# Patient Record
Sex: Female | Born: 1975 | Race: White | Hispanic: No | Marital: Married | Smoking: Current every day smoker
Health system: Southern US, Community
[De-identification: ages and names within clinical notes are randomized; demographics above are authoritative.]

## PROBLEM LIST (undated history)

## (undated) DIAGNOSIS — E282 Polycystic ovarian syndrome: Secondary | ICD-10-CM

## (undated) DIAGNOSIS — E785 Hyperlipidemia, unspecified: Secondary | ICD-10-CM

## (undated) DIAGNOSIS — K219 Gastro-esophageal reflux disease without esophagitis: Secondary | ICD-10-CM

## (undated) DIAGNOSIS — R51 Headache: Secondary | ICD-10-CM

## (undated) DIAGNOSIS — D72829 Elevated white blood cell count, unspecified: Secondary | ICD-10-CM

## (undated) DIAGNOSIS — Z8709 Personal history of other diseases of the respiratory system: Secondary | ICD-10-CM

## (undated) DIAGNOSIS — R7303 Prediabetes: Secondary | ICD-10-CM

## (undated) DIAGNOSIS — G4730 Sleep apnea, unspecified: Secondary | ICD-10-CM

## (undated) DIAGNOSIS — M1990 Unspecified osteoarthritis, unspecified site: Secondary | ICD-10-CM

## (undated) DIAGNOSIS — R002 Palpitations: Secondary | ICD-10-CM

## (undated) HISTORY — PX: PILONIDAL CYST EXCISION: SHX744

## (undated) HISTORY — PX: WISDOM TOOTH EXTRACTION: SHX21

## (undated) HISTORY — PX: SINUS SURGERY WITH INSTATRAK: SHX5215

## (undated) HISTORY — DX: Gastro-esophageal reflux disease without esophagitis: K21.9

## (undated) HISTORY — DX: Elevated white blood cell count, unspecified: D72.829

## (undated) HISTORY — DX: Hyperlipidemia, unspecified: E78.5

## (undated) HISTORY — PX: CHOLECYSTECTOMY: SHX55

---

## 2010-05-23 ENCOUNTER — Emergency Department (HOSPITAL_COMMUNITY): Admission: EM | Admit: 2010-05-23 | Discharge: 2010-05-23 | Payer: Self-pay | Admitting: Emergency Medicine

## 2010-05-26 DIAGNOSIS — R002 Palpitations: Secondary | ICD-10-CM

## 2010-05-27 ENCOUNTER — Ambulatory Visit: Payer: Self-pay | Admitting: Cardiology

## 2010-05-27 ENCOUNTER — Encounter: Payer: Self-pay | Admitting: Cardiology

## 2010-05-27 DIAGNOSIS — F17200 Nicotine dependence, unspecified, uncomplicated: Secondary | ICD-10-CM

## 2010-06-09 ENCOUNTER — Ambulatory Visit: Payer: Self-pay

## 2010-06-09 ENCOUNTER — Ambulatory Visit (HOSPITAL_COMMUNITY): Admission: RE | Admit: 2010-06-09 | Discharge: 2010-06-09 | Payer: Self-pay | Admitting: Cardiology

## 2010-06-09 ENCOUNTER — Ambulatory Visit: Payer: Self-pay | Admitting: Internal Medicine

## 2010-07-08 ENCOUNTER — Encounter: Payer: Self-pay | Admitting: Cardiology

## 2010-07-08 ENCOUNTER — Ambulatory Visit: Payer: Self-pay | Admitting: Cardiology

## 2010-07-08 DIAGNOSIS — I517 Cardiomegaly: Secondary | ICD-10-CM | POA: Insufficient documentation

## 2010-11-24 NOTE — Assessment & Plan Note (Signed)
Summary: [facility]   Visit Type:  Post-hospital  CC:  palpitations.  History of Present Illness: 35 year old female for evaluation of palpitations. Seen in the emergency room on July 30. D-dimer, one set of cardiac markers, potassium, urine pregnancy test normal. White blood cell count was mildly elevated. No significant arrhythmias noted in the emergency room. Because of palpitations we were asked to further evaluate. The patient states that she has had intermittent palpitations for some time. They are described as a skip followed by a pause. They are not sustained. There is no associated chest pain, shortness of breath or syncope. She otherwise does not have significant dyspnea on exertion, orthopnea, PND, pedal edema, exertional chest pain or history of syncope.  Preventive Screening-Counseling & Management  Alcohol-Tobacco     Smoking Status: current  Current Medications (verified): 1)  Birth Control Pill .... Once Daily  Allergies: 1)  ! Sulfa 2)  ! * Shellfish  Past History:  Past Medical History: Borderline hyperlipidemia Mild elevation in WBC followed by oncology  Past Surgical History: Wisdom teeth Sinus surgery Cholecystectomy Pilonidal cyst removed  Family History: Reviewed history and no changes required. Father and brothers with Htn  Social History: Reviewed history and no changes required. Full Time Single  Tobacco Use - Yes.  Alcohol Use - no Smoking Status:  current  Review of Systems       Recent sinusitis but no fevers or chills, productive cough, hemoptysis, dysphasia, odynophagia, melena, hematochezia, dysuria, hematuria, rash, seizure activity, orthopnea, PND, pedal edema, claudication. Remaining systems are negative.   Vital Signs:  Patient profile:   35 year old female Height:      65 inches Weight:      317 pounds BMI:     52.94 Pulse rate:   78 / minute Pulse rhythm:   regular Resp:     18 per minute BP sitting:   110 / 70   (left arm) Cuff size:   large  Vitals Entered By: Vikki Ports (May 27, 2010 10:47 AM)  Physical Exam  General:  Well developed/morbidly obese in NAD Skin warm/dry Patient not depressed No peripheral clubbing Back-normal HEENT-normal/normal eyelids Neck supple/normal carotid upstroke bilaterally; no bruits; no JVD; no thyromegaly chest - CTA/ normal expansion CV - RRR/normal S1 and S2; no murmurs, rubs or gallops; no rub; PMI nondisplaced Abdomen -NT/ND, no HSM, no mass, + bowel sounds, no bruit 2+ femoral pulses, no bruits Ext-no edema, chords, 2+ DP Neuro-grossly nonfocal     EKG  Procedure date:  05/27/2010  Findings:      Sinus rhythm at a rate of 78. Axis normal. No significant ST changes.  Impression & Recommendations:  Problem # 1:  PALPITATIONS (ICD-785.1) Patient is describing what sounds to be either PVCs or PACs. Her palpitations are not sustained. We will schedule an echocardiogram to quantify LV function. I've asked her to decrease her caffeine use. If her symptoms persist we will consider a beta blocker in the future. We could also consider a CardioNet monitor. Orders: Echocardiogram (Echo)  Problem # 2:  TOBACCO ABUSE (ICD-305.1) Patient counseled on discontinuing her between 3-10 minutes.  Patient Instructions: 1)  Your physician recommends that you schedule a follow-up appointment in: 6 WEEKS 2)  Your physician has requested that you have an echocardiogram.  Echocardiography is a painless test that uses sound waves to create images of your heart. It provides your doctor with information about the size and shape of your heart and how well  your heart's chambers and valves are working.  This procedure takes approximately one hour. There are no restrictions for this procedure.

## 2010-11-24 NOTE — Assessment & Plan Note (Signed)
Summary: [facility]   Visit Type:  Follow-up  CC:  Pt. still having palpitations.  History of Present Illness: 35 year old female I saw in August of 2011 for evaluation of palpitations. Seen in the emergency room on July 30. D-dimer, one set of cardiac markers, potassium, urine pregnancy test normal. White blood cell count was mildly elevated. No significant arrhythmias noted in the emergency room. Echocardiogram in August of 2011 revealed normal LV function and mild RVE. Since I saw her previously, she denies dyspnea, chest pain, pedal edema or syncope. She has discontinued her caffeine use. Patient has dced caffeine. She still has an occasional skip but not as frequently. There is no associated symptoms.  Current Medications (verified): 1)  Birth Control Pill .... Once Daily  Allergies: 1)  ! Sulfa 2)  ! * Shellfish  Past History:  Past Medical History: Reviewed history from 05/27/2010 and no changes required. Borderline hyperlipidemia Mild elevation in WBC followed by oncology  Past Surgical History: Reviewed history from 05/27/2010 and no changes required. Wisdom teeth Sinus surgery Cholecystectomy Pilonidal cyst removed  Social History: Reviewed history from 05/27/2010 and no changes required. Full Time Single  Tobacco Use - Yes.  Alcohol Use - no  Review of Systems       no fevers or chills, productive cough, hemoptysis, dysphasia, odynophagia, melena, hematochezia, dysuria, hematuria, rash, seizure activity, orthopnea, PND, pedal edema, claudication. Remaining systems are negative.   Vital Signs:  Patient profile:   35 year old female Height:      65 inches Weight:      328.50 pounds BMI:     54.86 Pulse rate:   80 / minute Pulse rhythm:   regular Resp:     18 per minute BP sitting:   120 / 66  (right arm) Cuff size:   large  Vitals Entered By: Vikki Ports (July 08, 2010 11:28 AM)  Physical Exam  General:  Well-developed obese in no  acute distress.  Skin is warm and dry.  HEENT is normal.  Neck is supple. No thyromegaly.  Chest is clear to auscultation with normal expansion.  Cardiovascular exam is regular rate and rhythm.  Abdominal exam nontender or distended. No masses palpated. Extremities show no edema. neuro grossly intact    Impression & Recommendations:  Problem # 1:  PALPITATIONS (ICD-785.1) Most likely related to PACs or PVCs. They have improved off of  caffeine. LV function normal. We will follow this expectantly. If her symptoms worsen in the future we will add a beta blocker and consider monitor.  Problem # 2:  CARDIOMEGALY (ICD-429.3) Mild right ventricular enlargement on echo. This is most likely related to obesity hypoventilation syndrome versus sleep apnea and increased pulmonary pressures. I have asked her to followup with her primary care physician for possible sleep study and initiation of CPAP as needed.  Problem # 3:  TOBACCO ABUSE (ICD-305.1) Patient counseled on discontinuing.

## 2011-01-09 LAB — URINALYSIS, ROUTINE W REFLEX MICROSCOPIC
Nitrite: NEGATIVE
Specific Gravity, Urine: 1.005 (ref 1.005–1.030)
Urobilinogen, UA: 0.2 mg/dL (ref 0.0–1.0)

## 2011-01-09 LAB — POCT CARDIAC MARKERS
CKMB, poc: <1 ng/mL — ABNORMAL LOW (ref 1.0–8.0)
Myoglobin, poc: 35.5 ng/mL (ref 12–200)
Troponin i, poc: <0.05 ng/mL (ref 0.00–0.09)

## 2011-01-09 LAB — POCT PREGNANCY, URINE: Preg Test, Ur: NEGATIVE

## 2011-01-09 LAB — DIFFERENTIAL
Basophils Relative: 2 % — ABNORMAL HIGH (ref 0–1)
Eosinophils Absolute: 0.3 10*3/uL (ref 0.0–0.7)
Eosinophils Relative: 2 % (ref 0–5)
Lymphs Abs: 4.7 10*3/uL — ABNORMAL HIGH (ref 0.7–4.0)
Neutro Abs: 7.3 10*3/uL (ref 1.7–7.7)
Neutrophils Relative %: 54 % (ref 43–77)

## 2011-01-09 LAB — RAPID URINE DRUG SCREEN, HOSP PERFORMED
Cocaine: NOT DETECTED
Tetrahydrocannabinol: NOT DETECTED

## 2011-01-09 LAB — URINE MICROSCOPIC-ADD ON

## 2011-01-09 LAB — BASIC METABOLIC PANEL
BUN: 8 mg/dL (ref 6–23)
Calcium: 8.6 mg/dL (ref 8.4–10.5)
Creatinine, Ser: 0.78 mg/dL (ref 0.4–1.2)
Glucose, Bld: 118 mg/dL — ABNORMAL HIGH (ref 70–99)
Sodium: 139 mEq/L (ref 135–145)

## 2011-01-09 LAB — CBC
MCH: 30.9 pg (ref 26.0–34.0)
Platelets: 265 10*3/uL (ref 150–400)
RDW: 13.8 % (ref 11.5–15.5)

## 2011-06-08 ENCOUNTER — Encounter: Payer: Self-pay | Admitting: Cardiology

## 2011-10-25 ENCOUNTER — Emergency Department (HOSPITAL_COMMUNITY)
Admission: EM | Admit: 2011-10-25 | Discharge: 2011-10-25 | Disposition: A | Discharge status: Home / Self Care | Payer: BC Managed Care – PPO | Attending: Emergency Medicine | Admitting: Emergency Medicine

## 2011-10-25 ENCOUNTER — Encounter (HOSPITAL_COMMUNITY): Payer: Self-pay | Admitting: *Deleted

## 2011-10-25 DIAGNOSIS — K089 Disorder of teeth and supporting structures, unspecified: Secondary | ICD-10-CM | POA: Insufficient documentation

## 2011-10-25 DIAGNOSIS — R6884 Jaw pain: Secondary | ICD-10-CM | POA: Insufficient documentation

## 2011-10-25 DIAGNOSIS — K047 Periapical abscess without sinus: Secondary | ICD-10-CM | POA: Insufficient documentation

## 2011-10-25 MED ORDER — OXYCODONE-ACETAMINOPHEN 5-325 MG PO TABS
2.0000 | ORAL_TABLET | Freq: Once | ORAL | Status: AC
  Administered 2011-10-25: 2 via ORAL
  Filled 2011-10-25: qty 2

## 2011-10-25 MED ORDER — PENICILLIN V POTASSIUM 500 MG PO TABS: 500.0000 mg | ORAL_TABLET | Freq: Four times a day (QID) | ORAL | Status: AC

## 2011-10-25 MED ORDER — LIDOCAINE HCL (PF) 1 % IJ SOLN
5.0000 mL | Freq: Once | INTRAMUSCULAR | Status: DC
  Filled 2011-10-25: qty 5

## 2011-10-25 MED ORDER — ONDANSETRON 4 MG PO TBDP
8.0000 mg | ORAL_TABLET | Freq: Once | ORAL | Status: AC
  Administered 2011-10-25: 8 mg via ORAL
  Filled 2011-10-25: qty 2

## 2011-10-25 MED ORDER — LIDOCAINE VISCOUS 2 % MT SOLN
OROMUCOSAL | Status: AC
  Filled 2011-10-25: qty 15

## 2011-10-25 MED ORDER — OXYCODONE-ACETAMINOPHEN 5-325 MG PO TABS: 1.0000 | ORAL_TABLET | ORAL | Status: AC | PRN

## 2011-10-25 NOTE — ED Notes (Signed)
Every documentation done by Elvera Lennox RN- Error in documentation. Wrong patient

## 2011-10-25 NOTE — ED Notes (Addendum)
No Blank Note

## 2011-10-25 NOTE — ED Provider Notes (Signed)
History     CSN: [id number]  Arrival date & time 10/25/11  0402   First MD Initiated Contact with Patient 10/25/11 [phone]      Chief Complaint  Patient presents with  . Jaw Pain     Patient is a 35 y.o. female presenting with tooth pain. The history is provided by the patient.  Dental PainThe primary symptoms include mouth pain. Primary symptoms do not include fever. The symptoms began 2 days ago. The symptoms are worsening. The symptoms are new. The symptoms occur constantly.    Past Medical History  Diagnosis Date  . Borderline hyperlipidemia   . Elevated WBC count     mild. followed by oncology    Past Surgical History  Procedure Date  . Wisdom tooth extraction   . Sinus surgery with instatrak   . Cholecystectomy   . Pilonidal cyst excision     Family History  Problem Relation Age of Onset  . Hypertension Father   . Hypertension Brother     History  Substance Use Topics  . Smoking status: Current Everyday Smoker -- 1.0 packs/day  . Smokeless tobacco: Never Used  . Alcohol Use: No    OB History    Grav Para Term Preterm Abortions TAB SAB Ect Mult Living                  Review of Systems  Constitutional: Negative for fever.  Gastrointestinal: Negative for vomiting.    Allergies  Shrimp and Sulfonamide derivatives  Home Medications   Current Outpatient Rx  Name Route Sig Dispense Refill  . IBUPROFEN 200 MG PO TABS Oral Take 400 mg by mouth every 6 (six) hours as needed. For pain     . LANSOPRAZOLE 30 MG PO CPDR Oral Take 30 mg by mouth daily as needed. For heartburn      . NAPROXEN SODIUM 220 MG PO TABS Oral Take 220 mg by mouth 2 (two) times daily as needed. For pain      . NON FORMULARY  BIRTH CONTROL PILL. Once daily       BP 154/94  Pulse 86  Temp(Src) 98.1 F (36.7 C) (Oral)  Resp 18  SpO2 98%  Physical Exam  CONSTITUTIONAL: Well developed/well nourished HEAD AND FACE: Normocephalic/atraumatic EYES: EOMI/PERRL ENMT: Mucous  membranes moist, no trismus, dental decay noted At site of previous extraction site of tooth 1, there is swelling/tenderness to surrounding gingiva NECK: supple no meningeal signs CV: S1/S2 noted, no murmurs/rubs/gallops noted LUNGS: Lungs are clear to auscultation bilaterally, no apparent distress ABDOMEN: soft, nontender, no rebound or guarding NEURO: Pt is awake/alert, moves all extremitiesx4 No facial droop, no facial numbness noted EXTREMITIES: pulses normal, full ROM SKIN: warm, color normal PSYCH: no abnormalities of mood noted   ED Course  Dental Performed by: Joya Gaskins Authorized by: Joya Gaskins Consent: Verbal consent obtained. Risks and benefits: risks, benefits and alternatives were discussed Consent given by: patient Patient understanding: patient states understanding of the procedure being performed Patient identity confirmed: verbally with patient Preparation: Patient was prepped and draped in the usual sterile fashion. Local anesthesia used: yes Anesthesia: nerve block Local anesthetic: bupivacaine 0.5% with epinephrine Anesthetic total: 1.5 ml Patient sedated: no Patient tolerance: Patient tolerated the procedure well with no immediate complications. Comments: NEEDLE DRAINAGE OF DENTAL ABSCESS, SMALL AMT OF PUS WAS EXTRACTED, NO COMPLICATIONS  NERVE BLOCK Performed by: Joya Gaskins Authorized by: Joya Gaskins Consent: Verbal consent obtained. Risks and benefits:  risks, benefits and alternatives were discussed Consent given by: patient Patient identity confirmed: verbally with patient Indications: pain relief Body area: face/mouth Nerve: infraorbital Laterality: right Patient sedated: no Preparation: Patient was prepped and draped in the usual sterile fashion. Location technique: anatomical landmarks Local anesthetic: bupivacaine 0.5% with epinephrine Anesthetic total: 1.5 ml Patient tolerance: Patient tolerated the procedure  well with no immediate complications.        MDM  Nursing notes reviewed and considered in documentation  Pt had molars extracted in the past, not recently        Joya Gaskins, MD 10/25/11 [phone]

## 2011-10-25 NOTE — ED Notes (Signed)
Pt states that her jaw and teeth hurt. She states she thinks she has sinus infection.

## 2013-08-15 ENCOUNTER — Encounter (INDEPENDENT_AMBULATORY_CARE_PROVIDER_SITE_OTHER): Payer: Self-pay | Admitting: Surgery

## 2013-08-15 ENCOUNTER — Other Ambulatory Visit (INDEPENDENT_AMBULATORY_CARE_PROVIDER_SITE_OTHER): Payer: Self-pay

## 2013-08-15 ENCOUNTER — Encounter (INDEPENDENT_AMBULATORY_CARE_PROVIDER_SITE_OTHER): Payer: Self-pay

## 2013-08-15 ENCOUNTER — Ambulatory Visit (INDEPENDENT_AMBULATORY_CARE_PROVIDER_SITE_OTHER): Payer: BC Managed Care – PPO | Admitting: Surgery

## 2013-08-15 NOTE — Patient Instructions (Signed)

## 2013-08-15 NOTE — Progress Notes (Signed)
Chief Complaint:  Morbid obesity BMI 58  History of Present Illness:  Crystal Huerta is an 37 y.o. female who comes in today with her husband having been worked up at [facility] and topical for a Roux-en-Y gastric bypass. She has done all the requisite it's 4 surgery but then the surgeons left hand she felt abandoned. She comes in today seeking to have Roux-en-Y gastric bypass.  She has had success with diets in the past having lost in excess of 70 pounds and combine that with exercise. Following this success she would regain her weight. Her comorbidities include polycystic ovary syndrome, and GERD with sometimes nocturnal regurgitation and choking, and history of elevated cholesterol although currently is under control.  Past Medical History  Diagnosis Date  . Borderline hyperlipidemia   . Elevated WBC count     mild. followed by oncology    Past Surgical History  Procedure Laterality Date  . Wisdom tooth extraction    . Sinus surgery with instatrak    . Cholecystectomy    . Pilonidal cyst excision      Current Outpatient Prescriptions  Medication Sig Dispense Refill  . ibuprofen (ADVIL,MOTRIN) 200 MG tablet Take 400 mg by mouth every 6 (six) hours as needed. For pain       . lansoprazole (PREVACID) 30 MG capsule Take 30 mg by mouth daily as needed. For heartburn        . naproxen sodium (ANAPROX) 220 MG tablet Take 220 mg by mouth 2 (two) times daily as needed. For pain        . NON FORMULARY BIRTH CONTROL PILL. Once daily        No current facility-administered medications for this visit.   Shrimp and Sulfonamide derivatives Family History  Problem Relation Age of Onset  . Hypertension Father   . Hypertension Brother    Social History:   reports that she has been smoking.  She has never used smokeless tobacco. She reports that she does not drink alcohol or use illicit drugs.   REVIEW OF SYSTEMS - PERTINENT POSITIVES ONLY: Positive for smoking. Negative for DVT. Positive for  GERD with nocturnal reflux. Positive for palpitations which have been described to her as benign.    Physical Exam:   Blood pressure 132/90, pulse 80, resp. rate 16, height 5\' 5"  (1.651 m), weight 346 lb 12.8 oz (157.307 kg). Body mass index is 57.71 kg/(m^2).  Gen:  WDWN white female NAD  Neurological: Alert and oriented to person, place, and time. Motor and sensory function is grossly intact  Head: Normocephalic and atraumatic.  Eyes: Conjunctivae are normal. Pupils are equal, round, and reactive to light. No scleral icterus.  Neck: Normal range of motion. Neck supple. No tracheal deviation or thyromegaly present.  Cardiovascular:  SR without murmurs or gallops.  No carotid bruits Respiratory: Effort normal.  No respiratory distress. No chest wall tenderness. Breath sounds normal.  No wheezes, rales or rhonchi.  Abdomen:  Obese with scars from laparoscopic cholecystectomy GU: Musculoskeletal: Normal range of motion. Extremities are nontender. No cyanosis, edema or clubbing noted Lymphadenopathy: No cervical, preauricular, postauricular or axillary adenopathy is present Skin: Skin is warm and dry. No rash noted. No diaphoresis. No erythema. No pallor. Pscyh: Normal mood and affect. Behavior is normal. Judgment and thought content normal.   LABORATORY RESULTS: No results found for this or any previous visit (from the past 48 hour(s)).  RADIOLOGY RESULTS: No results found.  Problem List: Patient Active Problem List  Diagnosis Date Noted  . CARDIOMEGALY 07/08/2010  . TOBACCO ABUSE 05/27/2010  . 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 Will have her see our dietitian. I talked to her extensively about going on a low carbohydrate diet including the avoidance of artificial sweeteners.    Matt B. Daphine Deutscher, MD, [facility] [phone] beeper [phone]  08/15/2013 2:12 PM

## 2013-08-20 LAB — CBC WITH DIFFERENTIAL/PLATELET
Basophils Relative: 1 % (ref 0–1)
HCT: 40.9 % (ref 36.0–46.0)
MCV: 84.3 fL (ref 78.0–100.0)
Neutro Abs: 8.2 10*3/uL — ABNORMAL HIGH (ref 1.7–7.7)
Neutrophils Relative %: 54 % (ref 43–77)
RDW: 14.5 % (ref 11.5–15.5)

## 2013-08-25 ENCOUNTER — Encounter: Payer: Self-pay | Admitting: Dietician

## 2013-08-25 ENCOUNTER — Encounter: Payer: BC Managed Care – PPO | Attending: Surgery | Admitting: Dietician

## 2013-08-25 DIAGNOSIS — Z713 Dietary counseling and surveillance: Secondary | ICD-10-CM | POA: Insufficient documentation

## 2013-08-25 NOTE — Patient Instructions (Signed)
Patient to call the [facility] to enroll in Pre-Op and Post-Op Nutrition Education when surgery date is scheduled. 

## 2013-08-25 NOTE — Progress Notes (Signed)
  Pre-Op Assessment Visit:  Pre-Operative RYGB Surgery  Medical Nutrition Therapy:  Appt start time: 1445   End time:  1515.  Patient was seen on 08/25/13 for Pre-Operative RYGB Nutrition Assessment. Assessment and letter of approval faxed to [facility] Bariatric Surgery Program coordinator on 08/25/13.   Handouts given during visit include:  Pre-Op Goals Bariatric Surgery Protein Shakes  Patient to call the [facility] to enroll in Pre-Op and Post-Op Nutrition Education when surgery date is scheduled.

## 2013-11-12 NOTE — Progress Notes (Signed)
Dr. Daphine DeutscherMartin - Please [facility].  Thanks.

## 2013-11-13 ENCOUNTER — Telehealth: Payer: Self-pay | Admitting: Dietician

## 2013-11-13 NOTE — Telephone Encounter (Signed)
Sent Yulonda the Pre-Op diet to follow for 2 weeks before surgery and the Post-Op diet to follow for 2 weeks after RYGB.

## 2013-11-19 ENCOUNTER — Encounter (HOSPITAL_COMMUNITY): Payer: Self-pay | Admitting: Pharmacy Technician

## 2013-11-19 NOTE — Patient Instructions (Addendum)
Ova FreshwaterLori Spoon  11/19/2013                           YOUR PROCEDURE IS SCHEDULED ON: 11/27/13               PLEASE REPORT TO SHORT STAY CENTER AT : 8:15 AM               CALL THIS NUMBER IF ANY PROBLEMS THE DAY OF SURGERY :               [phone]                      REMEMBER:   Do not eat food or drink liquids AFTER MIDNIGHT   Take these medicines the morning of surgery with A SIP OF WATER: PREVACID   Do not wear jewelry, make-up   Do not wear lotions, powders, or perfumes.   Do not shave legs or underarms 12 hrs. before surgery (men may shave face)  Do not bring valuables to the hospital.  Contacts, dentures or bridgework may not be worn into surgery.  Leave suitcase in the car. After surgery it may be brought to your room.  For patients admitted to the hospital more than one night, checkout time is 11:00                          The day of discharge.   Patients discharged the day of surgery will not be allowed to drive home                             If going home same day of surgery, must have someone stay with you first                           24 hrs at home and arrange for some one to drive you home from hospital.    Special Instructions:   Please read over the following fact sheets that you were given:                                 2. [place] PREPARING FOR SURGERY SHEET                                                X_____________________________________________________________________        Failure to follow these instructions may result in cancellation of your surgery

## 2013-11-20 ENCOUNTER — Encounter (INDEPENDENT_AMBULATORY_CARE_PROVIDER_SITE_OTHER): Payer: Self-pay

## 2013-11-20 ENCOUNTER — Encounter (HOSPITAL_COMMUNITY): Payer: Self-pay

## 2013-11-20 ENCOUNTER — Encounter (HOSPITAL_COMMUNITY)
Admission: RE | Admit: 2013-11-20 | Discharge: 2013-11-20 | Disposition: A | Discharge status: Home / Self Care | Payer: BC Managed Care – PPO | Source: Ambulatory Visit | Attending: Surgery | Admitting: Surgery

## 2013-11-20 DIAGNOSIS — Z01810 Encounter for preprocedural cardiovascular examination: Secondary | ICD-10-CM | POA: Insufficient documentation

## 2013-11-20 DIAGNOSIS — Z01812 Encounter for preprocedural laboratory examination: Secondary | ICD-10-CM | POA: Insufficient documentation

## 2013-11-20 DIAGNOSIS — Z01818 Encounter for other preprocedural examination: Secondary | ICD-10-CM | POA: Insufficient documentation

## 2013-11-20 HISTORY — DX: Palpitations: R00.2

## 2013-11-20 HISTORY — DX: Unspecified osteoarthritis, unspecified site: M19.90

## 2013-11-20 HISTORY — DX: Personal history of other diseases of the respiratory system: Z87.09

## 2013-11-20 HISTORY — DX: Prediabetes: R73.03

## 2013-11-20 HISTORY — DX: Polycystic ovarian syndrome: E28.2

## 2013-11-20 HISTORY — DX: Headache: R51

## 2013-11-20 HISTORY — DX: Sleep apnea, unspecified: G47.30

## 2013-11-20 LAB — COMPREHENSIVE METABOLIC PANEL
ALT: 20 U/L (ref 0–35)
AST: 16 U/L (ref 0–37)
Albumin: 3.4 g/dL — ABNORMAL LOW (ref 3.5–5.2)
Alkaline Phosphatase: 79 U/L (ref 39–117)
BUN: 10 mg/dL (ref 6–23)
CHLORIDE: 102 meq/L (ref 96–112)
CO2: 23 mEq/L (ref 19–32)
Calcium: 9.1 mg/dL (ref 8.4–10.5)
Creatinine, Ser: 0.79 mg/dL (ref 0.50–1.10)
GFR calc Af Amer: >90 mL/min (ref >90)
GFR calc non Af Amer: >90 mL/min (ref >90)
Glucose, Bld: 128 mg/dL — ABNORMAL HIGH (ref 70–99)
Potassium: 4 mEq/L (ref 3.7–5.3)
Sodium: 139 mEq/L (ref 137–147)
Total Bilirubin: 0.2 mg/dL — ABNORMAL LOW (ref 0.3–1.2)
Total Protein: 7.5 g/dL (ref 6.0–8.3)

## 2013-11-20 LAB — CBC
HEMATOCRIT: 42.3 % (ref 36.0–46.0)
HEMOGLOBIN: 14.4 g/dL (ref 12.0–15.0)
MCH: 29.6 pg (ref 26.0–34.0)
MCHC: 34 g/dL (ref 30.0–36.0)
MCV: 86.9 fL (ref 78.0–100.0)
PLATELETS: 258 10*3/uL (ref 150–400)
RBC: 4.87 MIL/uL (ref 3.87–5.11)
RDW: 14.2 % (ref 11.5–15.5)
WBC: 12.1 10*3/uL — AB (ref 4.0–10.5)

## 2013-11-20 LAB — HCG, SERUM, QUALITATIVE: Preg, Serum: NEGATIVE

## 2013-11-21 NOTE — Progress Notes (Signed)
SLEEP STUDY ON CHART

## 2013-11-23 ENCOUNTER — Encounter (INDEPENDENT_AMBULATORY_CARE_PROVIDER_SITE_OTHER): Payer: Self-pay | Admitting: Surgery

## 2013-11-23 ENCOUNTER — Ambulatory Visit (INDEPENDENT_AMBULATORY_CARE_PROVIDER_SITE_OTHER): Payer: BC Managed Care – PPO | Admitting: Surgery

## 2013-11-23 NOTE — Progress Notes (Signed)
Chief Complaint:  Morbid obesity BMI 58  History of Present Illness:  Crystal Huerta is an 38 y.o. female who comes in today with her husband having been worked up at [facility] and prepared for a Roux-en-Y gastric bypass. She has done all the workup for surgery but then the surgeons left hand she felt abandoned. She comes in today seeking to have Roux-en-Y gastric bypass.  I have discussed the procedure again with her and she is scheduled for next Tuesday.    She has had success with diets in the past having lost in excess of 70 pounds and combine that with exercise. Following this success she would regain her weight. Her comorbidities include polycystic ovary syndrome, and GERD with sometimes nocturnal regurgitation and choking, and history of elevated cholesterol although currently is under control.  Past Medical History  Diagnosis Date  . Borderline hyperlipidemia   . Elevated WBC count     mild. followed by oncology  . GERD (gastroesophageal reflux disease)   . Heart palpitations     with caffien  . History of sinusitis   . Headache(784.0)     migraines  . Arthritis   . Polycystic ovarian disease   . Borderline diabetes   . Sleep apnea     "mild sleep apnea" does not need c pap per pt    Past Surgical History  Procedure Laterality Date  . Wisdom tooth extraction    . Sinus surgery with instatrak    . Cholecystectomy    . Pilonidal cyst excision      Current Outpatient Prescriptions  Medication Sig Dispense Refill  . ibuprofen (ADVIL,MOTRIN) 200 MG tablet Take 400 mg by mouth every 6 (six) hours as needed. For pain       . lansoprazole (PREVACID) 30 MG capsule Take 30 mg by mouth daily as needed. For heartburn        . Vitamin D, Ergocalciferol, (DRISDOL) 50000 UNITS CAPS capsule Take 50,000 Units by mouth 2 (two) times a week. Thursday and Saturday       No current facility-administered medications for this visit.   Shrimp and Sulfonamide derivatives Family History   Problem Relation Age of Onset  . Hypertension Father   . Hypertension Brother   . GER disease Other   . Hyperlipidemia Other   . Hypertension Other   . Stroke Other   . Diabetes Other   . Obesity Other   . Sleep apnea Other    Social History:   reports that she has been smoking.  She has never used smokeless tobacco. She reports that she does not drink alcohol or use illicit drugs.   REVIEW OF SYSTEMS - PERTINENT POSITIVES ONLY: Positive for smoking. Negative for DVT. Positive for GERD with nocturnal reflux. Positive for palpitations which have been described to her as benign.    Physical Exam:   Height 5\' 5"  (1.651 m), weight 350 lb (158.759 kg), last menstrual period 08/20/2013. Body mass index is 58.24 kg/(m^2).  Gen:  WDWN white female NAD  Neurological: Alert and oriented to person, place, and time. Motor and sensory function is grossly intact  Head: Normocephalic and atraumatic.  Eyes: Conjunctivae are normal. Pupils are equal, round, and reactive to light. No scleral icterus.  Neck: Normal range of motion. Neck supple. No tracheal deviation or thyromegaly present.  Cardiovascular:  SR without murmurs or gallops.  No carotid bruits Respiratory: Effort normal.  No respiratory distress. No chest wall tenderness. Breath sounds normal.  No wheezes,  rales or rhonchi.  Abdomen:  Obese with scars from laparoscopic cholecystectomy GU: Musculoskeletal: Normal range of motion. Extremities are nontender. No cyanosis, edema or clubbing noted Lymphadenopathy: No cervical, preauricular, postauricular or axillary adenopathy is present Skin: Skin is warm and dry. No rash noted. No diaphoresis. No erythema. No pallor. Pscyh: Normal mood and affect. Behavior is normal. Judgment and thought content normal.   LABORATORY RESULTS: No results found for this or any previous visit (from the past 48 hour(s)).  RADIOLOGY RESULTS: No results found.  Problem List: Patient Active Problem List    Diagnosis Date Noted  . Morbid obesity BMI 58 08/15/2013  . CARDIOMEGALY 07/08/2010  . TOBACCO ABUSE 05/27/2010  . 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     Matt B. Daphine Deutscher, MD, [facility] [phone] beeper [phone]  11/23/2013 3:50 PM

## 2013-11-23 NOTE — Patient Instructions (Signed)

## 2013-11-26 ENCOUNTER — Other Ambulatory Visit (INDEPENDENT_AMBULATORY_CARE_PROVIDER_SITE_OTHER): Payer: Self-pay | Admitting: Surgery

## 2013-11-27 ENCOUNTER — Encounter (HOSPITAL_COMMUNITY)
Admission: RE | Disposition: A | Discharge status: Home / Self Care | Payer: Self-pay | Source: Ambulatory Visit | Attending: Surgery

## 2013-11-27 ENCOUNTER — Encounter (HOSPITAL_COMMUNITY): Payer: BC Managed Care – PPO | Admitting: Anesthesiology

## 2013-11-27 ENCOUNTER — Inpatient Hospital Stay (HOSPITAL_COMMUNITY): Payer: BC Managed Care – PPO | Admitting: Anesthesiology

## 2013-11-27 ENCOUNTER — Inpatient Hospital Stay (HOSPITAL_COMMUNITY)
Admission: RE | Admit: 2013-11-27 | Discharge: 2013-11-29 | DRG: 621 | Disposition: A | Discharge status: Home / Self Care | Payer: BC Managed Care – PPO | Source: Ambulatory Visit | Attending: Surgery | Admitting: Surgery

## 2013-11-27 ENCOUNTER — Encounter (HOSPITAL_COMMUNITY): Payer: Self-pay | Admitting: *Deleted

## 2013-11-27 DIAGNOSIS — F17200 Nicotine dependence, unspecified, uncomplicated: Secondary | ICD-10-CM | POA: Diagnosis present

## 2013-11-27 DIAGNOSIS — E282 Polycystic ovarian syndrome: Secondary | ICD-10-CM

## 2013-11-27 DIAGNOSIS — Z01812 Encounter for preprocedural laboratory examination: Secondary | ICD-10-CM

## 2013-11-27 DIAGNOSIS — Z791 Long term (current) use of non-steroidal anti-inflammatories (NSAID): Secondary | ICD-10-CM

## 2013-11-27 DIAGNOSIS — M129 Arthropathy, unspecified: Secondary | ICD-10-CM | POA: Diagnosis present

## 2013-11-27 DIAGNOSIS — Z79899 Other long term (current) drug therapy: Secondary | ICD-10-CM

## 2013-11-27 DIAGNOSIS — E7800 Pure hypercholesterolemia, unspecified: Secondary | ICD-10-CM | POA: Diagnosis present

## 2013-11-27 DIAGNOSIS — K2100 Gastro-esophageal reflux disease with esophagitis, without bleeding: Secondary | ICD-10-CM

## 2013-11-27 DIAGNOSIS — Z6841 Body Mass Index (BMI) 40.0 and over, adult: Secondary | ICD-10-CM

## 2013-11-27 DIAGNOSIS — K219 Gastro-esophageal reflux disease without esophagitis: Secondary | ICD-10-CM | POA: Diagnosis present

## 2013-11-27 DIAGNOSIS — G4730 Sleep apnea, unspecified: Secondary | ICD-10-CM | POA: Diagnosis present

## 2013-11-27 DIAGNOSIS — Z9884 Bariatric surgery status: Secondary | ICD-10-CM

## 2013-11-27 DIAGNOSIS — Z01810 Encounter for preprocedural cardiovascular examination: Secondary | ICD-10-CM

## 2013-11-27 HISTORY — PX: GASTRIC ROUX-EN-Y: SHX5262

## 2013-11-27 LAB — CBC
HCT: 41 % (ref 36.0–46.0)
Hemoglobin: 14 g/dL (ref 12.0–15.0)
MCH: 29.9 pg (ref 26.0–34.0)
MCHC: 34.1 g/dL (ref 30.0–36.0)
MCV: 87.4 fL (ref 78.0–100.0)
PLATELETS: 289 10*3/uL (ref 150–400)
RBC: 4.69 MIL/uL (ref 3.87–5.11)
RDW: 14.4 % (ref 11.5–15.5)
WBC: 20.4 10*3/uL — AB (ref 4.0–10.5)

## 2013-11-27 LAB — HEMOGLOBIN AND HEMATOCRIT, BLOOD
HEMATOCRIT: 42.4 % (ref 36.0–46.0)
Hemoglobin: 14.1 g/dL (ref 12.0–15.0)

## 2013-11-27 LAB — CREATININE, SERUM
Creatinine, Ser: 0.9 mg/dL (ref 0.50–1.10)
GFR, EST NON AFRICAN AMERICAN: 80 mL/min — AB (ref >90)

## 2013-11-27 SURGERY — LAPAROSCOPIC ROUX-EN-Y GASTRIC BYPASS WITH UPPER ENDOSCOPY
Anesthesia: General

## 2013-11-27 MED ORDER — MIDAZOLAM HCL 2 MG/2ML IJ SOLN
INTRAMUSCULAR | Status: AC
  Filled 2013-11-27: qty 2

## 2013-11-27 MED ORDER — TISSEEL VH 10 ML EX KIT
PACK | CUTANEOUS | Status: AC
  Filled 2013-11-27: qty 1

## 2013-11-27 MED ORDER — UNJURY CHOCOLATE CLASSIC POWDER: 2.0000 [oz_av] | Freq: Four times a day (QID) | ORAL | Status: DC

## 2013-11-27 MED ORDER — ROCURONIUM BROMIDE 100 MG/10ML IV SOLN
INTRAVENOUS | Status: DC | PRN
  Administered 2013-11-27: 30 mg via INTRAVENOUS
  Administered 2013-11-27: 70 mg via INTRAVENOUS
  Administered 2013-11-27: 20 mg via INTRAVENOUS
  Administered 2013-11-27 (×3): 10 mg via INTRAVENOUS

## 2013-11-27 MED ORDER — HYDROMORPHONE HCL PF 2 MG/ML IJ SOLN
INTRAMUSCULAR | Status: AC
  Filled 2013-11-27: qty 1

## 2013-11-27 MED ORDER — GLYCOPYRROLATE 0.2 MG/ML IJ SOLN
INTRAMUSCULAR | Status: AC
  Filled 2013-11-27: qty 3

## 2013-11-27 MED ORDER — DEXAMETHASONE SODIUM PHOSPHATE 10 MG/ML IJ SOLN
INTRAMUSCULAR | Status: AC
  Filled 2013-11-27: qty 1

## 2013-11-27 MED ORDER — DEXAMETHASONE SODIUM PHOSPHATE 10 MG/ML IJ SOLN
INTRAMUSCULAR | Status: DC | PRN
  Administered 2013-11-27: 10 mg via INTRAVENOUS

## 2013-11-27 MED ORDER — HEPARIN SODIUM (PORCINE) 5000 UNIT/ML IJ SOLN
5000.0000 [IU] | Freq: Three times a day (TID) | INTRAMUSCULAR | Status: DC
  Administered 2013-11-27 – 2013-11-29 (×5): 5000 [IU] via SUBCUTANEOUS
  Filled 2013-11-27 (×8): qty 1

## 2013-11-27 MED ORDER — MORPHINE SULFATE 10 MG/ML IJ SOLN
2.0000 mg | INTRAMUSCULAR | Status: DC | PRN
  Administered 2013-11-27: 4 mg via INTRAVENOUS
  Administered 2013-11-27: 2 mg via INTRAVENOUS
  Administered 2013-11-28 – 2013-11-29 (×3): 4 mg via INTRAVENOUS
  Filled 2013-11-27 (×5): qty 1

## 2013-11-27 MED ORDER — BUPIVACAINE LIPOSOME 1.3 % IJ SUSP
20.0000 mL | Freq: Once | INTRAMUSCULAR | Status: DC
  Filled 2013-11-27: qty 20

## 2013-11-27 MED ORDER — KCL IN DEXTROSE-NACL 20-5-0.45 MEQ/L-%-% IV SOLN
INTRAVENOUS | Status: DC
  Administered 2013-11-27: 1000 mL via INTRAVENOUS
  Administered 2013-11-28 (×2): via INTRAVENOUS
  Filled 2013-11-27 (×6): qty 1000

## 2013-11-27 MED ORDER — DEXTROSE 5 % IV SOLN
INTRAVENOUS | Status: AC
  Filled 2013-11-27 (×2): qty 1

## 2013-11-27 MED ORDER — HYDROMORPHONE HCL PF 1 MG/ML IJ SOLN
INTRAMUSCULAR | Status: DC | PRN
  Administered 2013-11-27 (×2): 0.5 mg via INTRAVENOUS

## 2013-11-27 MED ORDER — BUPIVACAINE LIPOSOME 1.3 % IJ SUSP
INTRAMUSCULAR | Status: DC | PRN
  Administered 2013-11-27: 20 mL

## 2013-11-27 MED ORDER — FENTANYL CITRATE 0.05 MG/ML IJ SOLN
INTRAMUSCULAR | Status: AC
  Filled 2013-11-27: qty 5

## 2013-11-27 MED ORDER — PROPOFOL 10 MG/ML IV BOLUS
INTRAVENOUS | Status: AC
Start: 2013-11-27 — End: 2013-11-27
  Filled 2013-11-27: qty 20

## 2013-11-27 MED ORDER — PROPOFOL 10 MG/ML IV BOLUS
INTRAVENOUS | Status: DC | PRN
  Administered 2013-11-27: 250 mg via INTRAVENOUS

## 2013-11-27 MED ORDER — MIDAZOLAM HCL 5 MG/5ML IJ SOLN
INTRAMUSCULAR | Status: DC | PRN
  Administered 2013-11-27: 2 mg via INTRAVENOUS

## 2013-11-27 MED ORDER — ROCURONIUM BROMIDE 100 MG/10ML IV SOLN
INTRAVENOUS | Status: AC
  Filled 2013-11-27: qty 1

## 2013-11-27 MED ORDER — FENTANYL CITRATE 0.05 MG/ML IJ SOLN
INTRAMUSCULAR | Status: DC | PRN
  Administered 2013-11-27: 100 ug via INTRAVENOUS
  Administered 2013-11-27 (×5): 50 ug via INTRAVENOUS
  Administered 2013-11-27: 100 ug via INTRAVENOUS
  Administered 2013-11-27: 50 ug via INTRAVENOUS

## 2013-11-27 MED ORDER — ONDANSETRON HCL 4 MG/2ML IJ SOLN
INTRAMUSCULAR | Status: AC
  Filled 2013-11-27: qty 2

## 2013-11-27 MED ORDER — DEXTROSE 5 % IV SOLN
2.0000 g | INTRAVENOUS | Status: AC
  Administered 2013-11-27 (×2): 2 g via INTRAVENOUS
  Filled 2013-11-27: qty 2

## 2013-11-27 MED ORDER — PROPOFOL 10 MG/ML IV BOLUS
INTRAVENOUS | Status: AC
  Filled 2013-11-27: qty 20

## 2013-11-27 MED ORDER — NEOSTIGMINE METHYLSULFATE 1 MG/ML IJ SOLN
INTRAMUSCULAR | Status: DC | PRN
  Administered 2013-11-27: 4 mg via INTRAVENOUS

## 2013-11-27 MED ORDER — HEPARIN SODIUM (PORCINE) 5000 UNIT/ML IJ SOLN
5000.0000 [IU] | INTRAMUSCULAR | Status: AC
  Administered 2013-11-27: 5000 [IU] via SUBCUTANEOUS
  Filled 2013-11-27: qty 1

## 2013-11-27 MED ORDER — UNJURY CHICKEN SOUP POWDER: 2.0000 [oz_av] | Freq: Four times a day (QID) | ORAL | Status: DC

## 2013-11-27 MED ORDER — ONDANSETRON HCL 4 MG/2ML IJ SOLN
4.0000 mg | INTRAMUSCULAR | Status: DC | PRN
  Administered 2013-11-28 – 2013-11-29 (×4): 4 mg via INTRAVENOUS
  Filled 2013-11-27 (×4): qty 2

## 2013-11-27 MED ORDER — KETOROLAC TROMETHAMINE 30 MG/ML IJ SOLN: 15.0000 mg | Freq: Once | INTRAMUSCULAR | Status: DC | PRN

## 2013-11-27 MED ORDER — GLYCOPYRROLATE 0.2 MG/ML IJ SOLN
INTRAMUSCULAR | Status: DC | PRN
  Administered 2013-11-27: 0.6 mg via INTRAVENOUS

## 2013-11-27 MED ORDER — LACTATED RINGERS IV SOLN
INTRAVENOUS | Status: DC
  Administered 2013-11-27: 13:00:00 via INTRAVENOUS
  Administered 2013-11-27: 1000 mL via INTRAVENOUS

## 2013-11-27 MED ORDER — ONDANSETRON HCL 4 MG/2ML IJ SOLN
INTRAMUSCULAR | Status: DC | PRN
  Administered 2013-11-27: 4 mg via INTRAVENOUS

## 2013-11-27 MED ORDER — OXYCODONE HCL 5 MG/5ML PO SOLN
5.0000 mg | ORAL | Status: DC | PRN
  Administered 2013-11-28: 10 mg via ORAL
  Administered 2013-11-28: 5 mg via ORAL
  Administered 2013-11-28 – 2013-11-29 (×4): 10 mg via ORAL
  Filled 2013-11-27 (×2): qty 10
  Filled 2013-11-27: qty 50
  Filled 2013-11-27 (×2): qty 10
  Filled 2013-11-27: qty 5

## 2013-11-27 MED ORDER — ACETAMINOPHEN 160 MG/5ML PO SOLN: 325.0000 mg | ORAL | Status: DC | PRN

## 2013-11-27 MED ORDER — KCL IN DEXTROSE-NACL 20-5-0.45 MEQ/L-%-% IV SOLN
INTRAVENOUS | Status: AC
  Administered 2013-11-27: 18:00:00
  Filled 2013-11-27: qty 1000

## 2013-11-27 MED ORDER — ROCURONIUM BROMIDE 100 MG/10ML IV SOLN: INTRAVENOUS | Status: DC | PRN

## 2013-11-27 MED ORDER — NEOSTIGMINE METHYLSULFATE 1 MG/ML IJ SOLN
INTRAMUSCULAR | Status: AC
  Filled 2013-11-27: qty 10

## 2013-11-27 MED ORDER — STERILE WATER FOR IRRIGATION IR SOLN
Status: DC | PRN
  Administered 2013-11-27: 1500 mL

## 2013-11-27 MED ORDER — UNJURY VANILLA POWDER
2.0000 [oz_av] | Freq: Four times a day (QID) | ORAL | Status: DC
  Administered 2013-11-29: 2 [oz_av] via ORAL

## 2013-11-27 MED ORDER — PROMETHAZINE HCL 25 MG/ML IJ SOLN: 6.2500 mg | INTRAMUSCULAR | Status: DC | PRN

## 2013-11-27 MED ORDER — HYDROMORPHONE HCL PF 1 MG/ML IJ SOLN
INTRAMUSCULAR | Status: AC
  Filled 2013-11-27: qty 1

## 2013-11-27 MED ORDER — LACTATED RINGERS IR SOLN
Status: DC | PRN
  Administered 2013-11-27: 1000 mL

## 2013-11-27 MED ORDER — HYDROMORPHONE HCL PF 1 MG/ML IJ SOLN
0.2500 mg | INTRAMUSCULAR | Status: DC | PRN
  Administered 2013-11-27: 0.25 mg via INTRAVENOUS
  Administered 2013-11-27 (×5): 0.5 mg via INTRAVENOUS
  Administered 2013-11-27 (×2): 0.25 mg via INTRAVENOUS

## 2013-11-27 MED ORDER — ACETAMINOPHEN 160 MG/5ML PO SOLN
650.0000 mg | ORAL | Status: DC | PRN
  Filled 2013-11-27: qty 20.3

## 2013-11-27 MED ORDER — LIDOCAINE HCL (CARDIAC) 20 MG/ML IV SOLN
INTRAVENOUS | Status: AC
  Filled 2013-11-27: qty 5

## 2013-11-27 MED ORDER — 0.9 % SODIUM CHLORIDE (POUR BTL) OPTIME
TOPICAL | Status: DC | PRN
  Administered 2013-11-27: 1000 mL

## 2013-11-27 SURGICAL SUPPLY — 64 items
APPLICATOR COTTON TIP 6IN STRL (MISCELLANEOUS) ×6 IMPLANT
BENZOIN TINCTURE PRP APPL 2/3 (GAUZE/BANDAGES/DRESSINGS) IMPLANT
BLADE SURG 15 STRL LF DISP TIS (BLADE) ×1 IMPLANT
BLADE SURG 15 STRL SS (BLADE) ×1
CABLE HIGH FREQUENCY MONO STRZ (ELECTRODE) IMPLANT
CANISTER SUCTION 2500CC (MISCELLANEOUS) ×2 IMPLANT
CLIP SUT LAPRA TY ABSORB (SUTURE) ×2 IMPLANT
DERMABOND ADVANCED (GAUZE/BANDAGES/DRESSINGS) ×2
DERMABOND ADVANCED .7 DNX12 (GAUZE/BANDAGES/DRESSINGS) ×2 IMPLANT
DEVICE SUTURE ENDOST 10MM (ENDOMECHANICALS) ×2 IMPLANT
DISSECTOR BLUNT TIP ENDO 5MM (MISCELLANEOUS) IMPLANT
DRAIN PENROSE 18X1/4 LTX STRL (WOUND CARE) ×2 IMPLANT
DRAPE CAMERA CLOSED 9X96 (DRAPES) ×2 IMPLANT
GAUZE SPONGE 4X4 16PLY XRAY LF (GAUZE/BANDAGES/DRESSINGS) ×2 IMPLANT
GLOVE BIOGEL M 8.0 STRL (GLOVE) ×2 IMPLANT
GOWN STRL REIN XL XLG (GOWN DISPOSABLE) ×2 IMPLANT
GOWN STRL REUS W/TWL XL LVL3 (GOWN DISPOSABLE) ×10 IMPLANT
HANDLE STAPLE EGIA 4 XL (STAPLE) ×2 IMPLANT
HOVERMATT SINGLE USE (MISCELLANEOUS) ×2 IMPLANT
KIT BASIN OR (CUSTOM PROCEDURE TRAY) ×2 IMPLANT
KIT GASTRIC LAVAGE 34FR ADT (SET/KITS/TRAYS/PACK) ×2 IMPLANT
MARKER SKIN DUAL TIP RULER LAB (MISCELLANEOUS) ×2 IMPLANT
NEEDLE SPNL 22GX3.5 QUINCKE BK (NEEDLE) ×2 IMPLANT
NS IRRIG 1000ML POUR BTL (IV SOLUTION) ×2 IMPLANT
PACK CARDIOVASCULAR III (CUSTOM PROCEDURE TRAY) ×2 IMPLANT
RELOAD EGIA 45 MED/THCK PURPLE (STAPLE) ×4 IMPLANT
RELOAD EGIA 45 TAN VASC (STAPLE) ×2 IMPLANT
RELOAD EGIA 60 MED/THCK PURPLE (STAPLE) ×8 IMPLANT
RELOAD EGIA 60 TAN VASC (STAPLE) ×4 IMPLANT
RELOAD ENDO STITCH 2.0 (ENDOMECHANICALS) ×9
SCISSORS LAP 5X45 EPIX DISP (ENDOMECHANICALS) ×2 IMPLANT
SEALANT SURGICAL APPL DUAL CAN (MISCELLANEOUS) ×2 IMPLANT
SET IRRIG TUBING LAPAROSCOPIC (IRRIGATION / IRRIGATOR) ×2 IMPLANT
SHEARS CURVED HARMONIC AC 45CM (MISCELLANEOUS) ×2 IMPLANT
SLEEVE ADV FIXATION 12X100MM (TROCAR) ×4 IMPLANT
SLEEVE ADV FIXATION 5X100MM (TROCAR) IMPLANT
SOLUTION ANTI FOG 6CC (MISCELLANEOUS) ×2 IMPLANT
SPONGE GAUZE 4X4 12PLY (GAUZE/BANDAGES/DRESSINGS) IMPLANT
STAPLER VISISTAT 35W (STAPLE) ×2 IMPLANT
STRIP CLOSURE SKIN 1/2X4 (GAUZE/BANDAGES/DRESSINGS) IMPLANT
STRIP PERI DRY VERITAS 45 (STAPLE) IMPLANT
STRIP PERI DRY VERITAS 60 (STAPLE) ×6 IMPLANT
SUT DVC SILK 2.0X39 (SUTURE) ×8 IMPLANT
SUT DVC VICRYL PGA 2.0X39 (SUTURE) ×8 IMPLANT
SUT RELOAD ENDO STITCH 2 48X1 (ENDOMECHANICALS) ×5
SUT RELOAD ENDO STITCH 2.0 (ENDOMECHANICALS) ×4
SUT VIC AB 2-0 SH 27 (SUTURE) ×1
SUT VIC AB 2-0 SH 27X BRD (SUTURE) ×1 IMPLANT
SUT VIC AB 4-0 SH 18 (SUTURE) ×2 IMPLANT
SUTURE RELOAD END STTCH 2 48X1 (ENDOMECHANICALS) ×5 IMPLANT
SUTURE RELOAD ENDO STITCH 2.0 (ENDOMECHANICALS) ×4 IMPLANT
SYR 20CC LL (SYRINGE) ×2 IMPLANT
SYR 30ML LL (SYRINGE) ×2 IMPLANT
SYR 50ML LL SCALE MARK (SYRINGE) ×2 IMPLANT
TOWEL OR 17X26 10 PK STRL BLUE (TOWEL DISPOSABLE) ×4 IMPLANT
TOWEL OR NON WOVEN STRL DISP B (DISPOSABLE) ×2 IMPLANT
TRAY FOLEY CATH 14FRSI W/METER (CATHETERS) ×2 IMPLANT
TROCAR ADV FIXATION 12X100MM (TROCAR) ×2 IMPLANT
TROCAR ADV FIXATION 5X100MM (TROCAR) ×2 IMPLANT
TROCAR BLADELESS OPT 5 100 (ENDOMECHANICALS) ×2 IMPLANT
TROCAR XCEL 12X100 BLDLESS (ENDOMECHANICALS) ×4 IMPLANT
TUBING CONNECTING 10 (TUBING) ×2 IMPLANT
TUBING ENDO SMARTCAP PENTAX (MISCELLANEOUS) ×2 IMPLANT
TUBING FILTER THERMOFLATOR (ELECTROSURGICAL) ×2 IMPLANT

## 2013-11-27 NOTE — Interval H&P Note (Signed)
History and Physical Interval Note:  11/27/2013 11:15 AM  Crystal Huerta  has presented today for surgery, with the diagnosis of morbid obesity   The various methods of treatment have been discussed with the patient and family. After consideration of risks, benefits and other options for treatment, the patient has consented to  Procedure(s): LAPAROSCOPIC ROUX-EN-Y GASTRIC BYPASS WITH UPPER ENDOSCOPY (N/A) as a surgical intervention .  The patient's history has been reviewed, patient examined, no change in status, stable for surgery.  I have reviewed the patient's chart and labs.  Questions were answered to the patient's satisfaction.     [name] B

## 2013-11-27 NOTE — Transfer of Care (Signed)
Immediate Anesthesia Transfer of Care Note  Patient: Crystal Huerta  Procedure(s) Performed: Procedure(s): LAPAROSCOPIC ROUX-EN-Y GASTRIC BYPASS WITH UPPER ENDOSCOPY (N/A)  Patient Location: PACU  Anesthesia Type:General  Level of Consciousness: awake, alert  and oriented  Airway & Oxygen Therapy: Patient Spontanous Breathing and Patient connected to face mask oxygen  Post-op Assessment: Report given to PACU RN and Post -op Vital signs reviewed and stable  Post vital signs: Reviewed and stable  Complications: No apparent anesthesia complications

## 2013-11-27 NOTE — Progress Notes (Signed)
SAO2 88 on arrival to PACU; coughing and deep breathing encouraged; SAO2 94 at present

## 2013-11-27 NOTE — H&P (View-Only) (Signed)
Chief Complaint:  Morbid obesity BMI 58  History of Present Illness:  Crystal Huerta is an 38 y.o. female who comes in today with her husband having been worked up at [facility] and prepared for a Roux-en-Y gastric bypass. She has done all the workup for surgery but then the surgeons left hand she felt abandoned. She comes in today seeking to have Roux-en-Y gastric bypass.  I have discussed the procedure again with her and she is scheduled for next Tuesday.    She has had success with diets in the past having lost in excess of 70 pounds and combine that with exercise. Following this success she would regain her weight. Her comorbidities include polycystic ovary syndrome, and GERD with sometimes nocturnal regurgitation and choking, and history of elevated cholesterol although currently is under control.  Past Medical History  Diagnosis Date  . Borderline hyperlipidemia   . Elevated WBC count     mild. followed by oncology  . GERD (gastroesophageal reflux disease)   . Heart palpitations     with caffien  . History of sinusitis   . Headache(784.0)     migraines  . Arthritis   . Polycystic ovarian disease   . Borderline diabetes   . Sleep apnea     "mild sleep apnea" does not need c pap per pt    Past Surgical History  Procedure Laterality Date  . Wisdom tooth extraction    . Sinus surgery with instatrak    . Cholecystectomy    . Pilonidal cyst excision      Current Outpatient Prescriptions  Medication Sig Dispense Refill  . ibuprofen (ADVIL,MOTRIN) 200 MG tablet Take 400 mg by mouth every 6 (six) hours as needed. For pain       . lansoprazole (PREVACID) 30 MG capsule Take 30 mg by mouth daily as needed. For heartburn        . Vitamin D, Ergocalciferol, (DRISDOL) 50000 UNITS CAPS capsule Take 50,000 Units by mouth 2 (two) times a week. Thursday and Saturday       No current facility-administered medications for this visit.   Shrimp and Sulfonamide derivatives Family History   Problem Relation Age of Onset  . Hypertension Father   . Hypertension Brother   . GER disease Other   . Hyperlipidemia Other   . Hypertension Other   . Stroke Other   . Diabetes Other   . Obesity Other   . Sleep apnea Other    Social History:   reports that she has been smoking.  She has never used smokeless tobacco. She reports that she does not drink alcohol or use illicit drugs.   REVIEW OF SYSTEMS - PERTINENT POSITIVES ONLY: Positive for smoking. Negative for DVT. Positive for GERD with nocturnal reflux. Positive for palpitations which have been described to her as benign.    Physical Exam:   Height 5\' 5"  (1.651 m), weight 350 lb (158.759 kg), last menstrual period 08/20/2013. Body mass index is 58.24 kg/(m^2).  Gen:  WDWN white female NAD  Neurological: Alert and oriented to person, place, and time. Motor and sensory function is grossly intact  Head: Normocephalic and atraumatic.  Eyes: Conjunctivae are normal. Pupils are equal, round, and reactive to light. No scleral icterus.  Neck: Normal range of motion. Neck supple. No tracheal deviation or thyromegaly present.  Cardiovascular:  SR without murmurs or gallops.  No carotid bruits Respiratory: Effort normal.  No respiratory distress. No chest wall tenderness. Breath sounds normal.  No wheezes,  rales or rhonchi.  Abdomen:  Obese with scars from laparoscopic cholecystectomy GU: Musculoskeletal: Normal range of motion. Extremities are nontender. No cyanosis, edema or clubbing noted Lymphadenopathy: No cervical, preauricular, postauricular or axillary adenopathy is present Skin: Skin is warm and dry. No rash noted. No diaphoresis. No erythema. No pallor. Pscyh: Normal mood and affect. Behavior is normal. Judgment and thought content normal.   LABORATORY RESULTS: No results found for this or any previous visit (from the past 48 hour(s)).  RADIOLOGY RESULTS: No results found.  Problem List: Patient Active Problem List    Diagnosis Date Noted  . Morbid obesity BMI 58 08/15/2013  . CARDIOMEGALY 07/08/2010  . TOBACCO ABUSE 05/27/2010  . 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     Matt B. Daphine Deutscher, MD, [facility] [phone] beeper [phone]  11/23/2013 3:50 PM

## 2013-11-27 NOTE — Anesthesia Preprocedure Evaluation (Addendum)
Anesthesia Evaluation  Patient identified by MRN, date of birth, ID band Patient awake    Reviewed: Allergy & Precautions, H&P , NPO status , Patient's Chart, lab work & pertinent test results  Airway Mallampati: III TM Distance: <3 FB Neck ROM: Full    Dental no notable dental hx.    Pulmonary sleep apnea , Current Smoker,  breath sounds clear to auscultation  + decreased breath sounds      Cardiovascular negative cardio ROS  Rhythm:Regular Rate:Normal     Neuro/Psych negative neurological ROS  negative psych ROS   GI/Hepatic Neg liver ROS, GERD-  ,  Endo/Other  Morbid obesity  Renal/GU negative Renal ROS  negative genitourinary   Musculoskeletal negative musculoskeletal ROS (+)   Abdominal (+) + obese,   Peds negative pediatric ROS (+)  Hematology negative hematology ROS (+)   Anesthesia Other Findings   Reproductive/Obstetrics negative OB ROS                          Anesthesia Physical Anesthesia Plan  ASA: III  Anesthesia Plan: General   Post-op Pain Management:    Induction: Intravenous  Airway Management Planned: Oral ETT  Additional Equipment:   Intra-op Plan:   Post-operative Plan: Extubation in OR  Informed Consent: I have reviewed the patients History and Physical, chart, labs and discussed the procedure including the risks, benefits and alternatives for the proposed anesthesia with the patient or authorized representative who has indicated his/her understanding and acceptance.   Dental advisory given  Plan Discussed with: CRNA and Surgeon  Anesthesia Plan Comments:         Anesthesia Quick Evaluation

## 2013-11-27 NOTE — Anesthesia Postprocedure Evaluation (Signed)
  Anesthesia Post-op Note  Patient: Crystal Huerta  Procedure(s) Performed: Procedure(s) (LRB): LAPAROSCOPIC ROUX-EN-Y GASTRIC BYPASS WITH UPPER ENDOSCOPY (N/A)  Patient Location: PACU  Anesthesia Type: General  Level of Consciousness: awake and alert   Airway and Oxygen Therapy: Patient Spontanous Breathing  Post-op Pain: mild  Post-op Assessment: Post-op Vital signs reviewed, Patient's Cardiovascular Status Stable, Respiratory Function Stable, Patent Airway and No signs of Nausea or vomiting  Last Vitals:  Filed Vitals:   11/27/13 1600  BP: 127/66  Pulse: 70  Temp:   Resp: 17    Post-op Vital Signs: stable   Complications: No apparent anesthesia complications

## 2013-11-27 NOTE — Op Note (Signed)
Surgeon: Pollyann SavoyMatt B. Daphine DeutscherMartin, MD, FACS Asst:  Jaclynn GuarneriBen Hoxworth, MD, FACS Anesthesia: General endotracheal Drains: None  Procedure: Laparoscopic Roux en Y gastric bypass with 40 cm BP limb and 100 cm Roux limb, antecolic, antegastric, candy cane to the left.  Closure of Peterson's defect. Upper endoscopy.   Description of Procedure:  The patient was [facility] and given general anesthesia.  The abdomen was prepped with PCMX and draped sterilely.  A time out was performed.    The operation began by identifying the ligament of Treitz. I measured 40 cm downstream and divided the bowel with a 6 cm Covidian stapler.  I sutured a Penrose drain along the Roux limb end.  I measured a 1 meter (100 cm) Roux limb and then placed the distal bowels to the BP limb side by side and performed a stapled jejunojejunostomy (6 cm). The common defect was closed from either end with 4-0 Vicryl using the Endo 360. The mesenteric defect was closed with a running 2-0 silk using the Endo 360. Tisseel was applied to the suture line.  The omentum was divided with the harmonic scalpel.  The Nathanson retractor was inserted in the left lateral segment of liver was retracted. The foregut dissection ensued.  I measured 5 cm along the lessor curvature and created a retrogastric window.  3 firings of the 6 cm Covidien purple load using peristrips on the last firing completed the pouch.    The Roux limb was then brought up with the candycane pointed left and a back row of sutures of 2-0 Vicryl were placed with the Endo 360. I opened along the right side of each structure and inserted the 4.5 cm stapler to create the gastrojejunostomy. The common defect was closed from either end with 2-0 Vicryl and a second row was placed anterior to that the Ewald tube acting as a stent across the anastomosis. The Penrose drain was removed. Peterson's defect was closed with 2-0 silk.   Endoscopy was performed by Dr. Johna SheriffHoxworth and revealed a 4 cm pouch  and patent anastomosis  The incisions were injected with Exparel and were closed with 4-0 Vicryl and Dermabond.    The patient was taken to the recovery room in satisfactory condition.  Matt B. Daphine DeutscherMartin, MD, FACS

## 2013-11-28 ENCOUNTER — Encounter (HOSPITAL_COMMUNITY): Payer: Self-pay | Admitting: Surgery

## 2013-11-28 ENCOUNTER — Inpatient Hospital Stay (HOSPITAL_COMMUNITY): Payer: BC Managed Care – PPO

## 2013-11-28 DIAGNOSIS — Z09 Encounter for follow-up examination after completed treatment for conditions other than malignant neoplasm: Secondary | ICD-10-CM

## 2013-11-28 LAB — CBC WITH DIFFERENTIAL/PLATELET
BASOS PCT: 0 % (ref 0–1)
Basophils Absolute: 0 10*3/uL (ref 0.0–0.1)
Eosinophils Absolute: 0 10*3/uL (ref 0.0–0.7)
Eosinophils Relative: 0 % (ref 0–5)
HCT: 40.5 % (ref 36.0–46.0)
Hemoglobin: 13.3 g/dL (ref 12.0–15.0)
Lymphocytes Relative: 13 % (ref 12–46)
Lymphs Abs: 2.4 10*3/uL (ref 0.7–4.0)
MCH: 29 pg (ref 26.0–34.0)
MCHC: 32.8 g/dL (ref 30.0–36.0)
MCV: 88.2 fL (ref 78.0–100.0)
Monocytes Absolute: 1.2 10*3/uL — ABNORMAL HIGH (ref 0.1–1.0)
Monocytes Relative: 6 % (ref 3–12)
NEUTROS ABS: 14.7 10*3/uL — AB (ref 1.7–7.7)
Neutrophils Relative %: 80 % — ABNORMAL HIGH (ref 43–77)
Platelets: 279 10*3/uL (ref 150–400)
RBC: 4.59 MIL/uL (ref 3.87–5.11)
RDW: 14.4 % (ref 11.5–15.5)
WBC: 18.3 10*3/uL — AB (ref 4.0–10.5)

## 2013-11-28 LAB — TROPONIN I

## 2013-11-28 LAB — HEMOGLOBIN AND HEMATOCRIT, BLOOD
HEMATOCRIT: 38.2 % (ref 36.0–46.0)
HEMOGLOBIN: 12.9 g/dL (ref 12.0–15.0)

## 2013-11-28 MED ORDER — IOHEXOL 300 MG/ML  SOLN
50.0000 mL | Freq: Once | INTRAMUSCULAR | Status: AC | PRN
  Administered 2013-11-28: 50 mL via ORAL

## 2013-11-28 MED ORDER — MORPHINE SULFATE 2 MG/ML IJ SOLN
INTRAMUSCULAR | Status: AC
  Administered 2013-11-28: 2 mg via INTRAVENOUS
  Filled 2013-11-28: qty 1

## 2013-11-28 NOTE — Progress Notes (Signed)
Patient ID: Crystal Huerta, female   DOB: May 25, 1976, 38 y.o.   MRN: [id number] [facility] Progress Note:   1 Day Post-Op  Subjective: Mental status is clear.  Throat is sore.   Objective: Vital signs in last 24 hours: Temp:  [97.6 F (36.4 C)-98.9 F (37.2 C)] 98.8 F (37.1 C) (02/04 0605) Pulse Rate:  [61-96] 62 (02/04 0605) Resp:  [16-24] 18 (02/04 0605) BP: (105-158)/(63-87) 110/72 mmHg (02/04 0605) SpO2:  [88 %-96 %] 94 % (02/04 0605)  Intake/Output from previous day: 02/03 0701 - 02/04 0700 In: 3520 [I.V.:3520] Out: 1035 [Urine:985; Blood:50] Intake/Output this shift:    Physical Exam: Work of breathing is not labored.  Sitting up in chair awaiting gastrograffin swallow.    Lab Results:  Results for orders placed during the hospital encounter of 11/27/13 (from the past 48 hour(s))  HEMOGLOBIN AND HEMATOCRIT, BLOOD     Status: None   Collection Time    11/27/13  4:06 PM      Result Value Range   Hemoglobin 14.1  12.0 - 15.0 g/dL   HCT 42.4  36.0 - 46.0 %  CBC     Status: Abnormal   Collection Time    11/27/13  5:13 PM      Result Value Range   WBC 20.4 (*) 4.0 - 10.5 K/uL   RBC 4.69  3.87 - 5.11 MIL/uL   Hemoglobin 14.0  12.0 - 15.0 g/dL   HCT 41.0  36.0 - 46.0 %   MCV 87.4  78.0 - 100.0 fL   MCH 29.9  26.0 - 34.0 pg   MCHC 34.1  30.0 - 36.0 g/dL   RDW 14.4  11.5 - 15.5 %   Platelets 289  150 - 400 K/uL  CREATININE, SERUM     Status: Abnormal   Collection Time    11/27/13  5:13 PM      Result Value Range   Creatinine, Ser 0.90  0.50 - 1.10 mg/dL   GFR calc non Af Amer 80 (*) >90 mL/min   GFR calc Af Amer >90  >90 mL/min   Comment: (NOTE)     The eGFR has been calculated using the CKD EPI equation.     This calculation has not been validated in all clinical situations.     eGFR's persistently <90 mL/min signify possible Chronic Kidney     Disease.  CBC WITH DIFFERENTIAL     Status: Abnormal   Collection Time    11/28/13  5:17 AM      Result  Value Range   WBC 18.3 (*) 4.0 - 10.5 K/uL   RBC 4.59  3.87 - 5.11 MIL/uL   Hemoglobin 13.3  12.0 - 15.0 g/dL   HCT 40.5  36.0 - 46.0 %   MCV 88.2  78.0 - 100.0 fL   MCH 29.0  26.0 - 34.0 pg   MCHC 32.8  30.0 - 36.0 g/dL   RDW 14.4  11.5 - 15.5 %   Platelets 279  150 - 400 K/uL   Neutrophils Relative % 80 (*) 43 - 77 %   Neutro Abs 14.7 (*) 1.7 - 7.7 K/uL   Lymphocytes Relative 13  12 - 46 %   Lymphs Abs 2.4  0.7 - 4.0 K/uL   Monocytes Relative 6  3 - 12 %   Monocytes Absolute 1.2 (*) 0.1 - 1.0 K/uL   Eosinophils Relative 0  0 - 5 %   Eosinophils Absolute 0.0  0.0 -  0.7 K/uL   Basophils Relative 0  0 - 1 %   Basophils Absolute 0.0  0.0 - 0.1 K/uL    Radiology/Results: No results found.  Anti-infectives: Anti-infectives   Start     Dose/Rate Route Frequency Ordered Stop   11/27/13 0900  cefOXitin (MEFOXIN) 2 g in dextrose 5 % 50 mL IVPB     2 g 100 mL/hr over 30 Minutes Intravenous On call to O.R. 11/27/13 0816 11/27/13 1330      Assessment/Plan: Problem List: Patient Active Problem List   Diagnosis Date Noted  . Morbid obesity with BMI of 50.0-59.9, adult 11/27/2013  . Morbid obesity BMI 58 08/15/2013  . CARDIOMEGALY 07/08/2010  . TOBACCO ABUSE 05/27/2010  . PALPITATIONS 05/26/2010    Await studies.  Stable postop 1 Day Post-Op    LOS: 1 day   Matt B. Hassell Done, MD, [facility] [phone] beeper [phone]  11/28/2013 8:49 AM

## 2013-11-28 NOTE — Care Management Note (Signed)
    Page 1 of 1   11/28/2013     10:56:45 AM   CARE MANAGEMENT NOTE 11/28/2013  Patient:  Crystal FreshwaterSPOON,Azlyn   Account Number:  [id number]  Date Initiated:  11/28/2013  Documentation initiated by:  Lorenda IshiharaPEELE,Josslyn Ciolek  Subjective/Objective Assessment:   38 yo female admitted s/p gastric bypass. PTA lived at home with spouse.     Action/Plan:   Home when stable   Anticipated DC Date:  12/01/2013   Anticipated DC Plan:  HOME/SELF CARE      DC Planning Services  CM consult      Choice offered to / List presented to:             Status of service:  Completed, signed off Medicare Important Message given?   (If response is "NO", the following Medicare IM given date fields will be blank) Date Medicare IM given:   Date Additional Medicare IM given:    Discharge Disposition:  HOME/SELF CARE  Per UR Regulation:  Reviewed for med. necessity/level of care/duration of stay  If discussed at Long Length of Stay Meetings, dates discussed:    Comments:

## 2013-11-28 NOTE — Progress Notes (Signed)
Notified MD, Dr. Maisie Fushomas, on call about low heart rate measuring mid 40's. New orders obtained. Will continue to monitor.

## 2013-11-28 NOTE — Progress Notes (Signed)
*  PRELIMINARY RESULTS* Vascular Ultrasound Lower extremity venous duplex has been completed.  Preliminary findings: no evidence of DVT   Farrel DemarkJill Eunice, RDMS, RVT  11/28/2013, 9:31 AM

## 2013-11-29 DIAGNOSIS — Z9884 Bariatric surgery status: Secondary | ICD-10-CM

## 2013-11-29 LAB — CBC WITH DIFFERENTIAL/PLATELET
BASOS ABS: 0.1 10*3/uL (ref 0.0–0.1)
Basophils Relative: 0 % (ref 0–1)
EOS PCT: 1 % (ref 0–5)
Eosinophils Absolute: 0.1 10*3/uL (ref 0.0–0.7)
HCT: 40.7 % (ref 36.0–46.0)
Hemoglobin: 13.3 g/dL (ref 12.0–15.0)
Lymphocytes Relative: 29 % (ref 12–46)
Lymphs Abs: 5 10*3/uL — ABNORMAL HIGH (ref 0.7–4.0)
MCH: 29.1 pg (ref 26.0–34.0)
MCHC: 32.7 g/dL (ref 30.0–36.0)
MCV: 89.1 fL (ref 78.0–100.0)
Monocytes Absolute: 1.4 10*3/uL — ABNORMAL HIGH (ref 0.1–1.0)
Monocytes Relative: 8 % (ref 3–12)
Neutro Abs: 11.1 10*3/uL — ABNORMAL HIGH (ref 1.7–7.7)
Neutrophils Relative %: 63 % (ref 43–77)
Platelets: 232 10*3/uL (ref 150–400)
RBC: 4.57 MIL/uL (ref 3.87–5.11)
RDW: 14.5 % (ref 11.5–15.5)
WBC: 17.6 10*3/uL — AB (ref 4.0–10.5)

## 2013-11-29 LAB — GLUCOSE, CAPILLARY: GLUCOSE-CAPILLARY: 130 mg/dL — AB (ref 70–99)

## 2013-11-29 MED ORDER — ONDANSETRON HCL 4 MG PO TABS: 4.0000 mg | ORAL_TABLET | Freq: Three times a day (TID) | ORAL | Status: AC | PRN

## 2013-11-29 MED ORDER — ENOXAPARIN SODIUM 80 MG/0.8ML ~~LOC~~ SOLN: 80.0000 mg | SUBCUTANEOUS | Status: DC

## 2013-11-29 MED ORDER — HYDROCODONE-ACETAMINOPHEN 7.5-325 MG/15ML PO SOLN: 15.0000 mL | Freq: Four times a day (QID) | ORAL | Status: DC | PRN

## 2013-11-29 MED ORDER — ENOXAPARIN SODIUM 80 MG/0.8ML ~~LOC~~ SOLN
80.0000 mg | SUBCUTANEOUS | Status: DC
  Administered 2013-11-29: 80 mg via SUBCUTANEOUS
  Filled 2013-11-29: qty 0.8

## 2013-11-29 NOTE — Discharge Summary (Signed)
Physician Discharge Summary  Patient ID: Crystal FreshwaterLori Huerta MRN: [id number] DOB/AGE: [birth year] 38 y.o.  Admit date: 11/27/2013 Discharge date: 11/29/2013  Admission Diagnoses:  Morbid obesity  Discharge Diagnoses:  Same post roux en Y gastric bypass  Active Problems:   Morbid obesity with BMI of 50.0-59.9, adult   Lap Roux en Y gastric bypass Feb 2015   Surgery:  Lap roux en Y gastric bypass  Discharged Condition: improved  Hospital Course:   Had surgery.  UGI OK.  Incisions OK.  Taking PO.  Diet advance ready for discharge.    Consults: none  Significant Diagnostic Studies: UGI OK    Discharge Exam: Blood pressure 151/79, pulse 66, temperature 97.7 F (36.5 C), temperature source Oral, resp. rate 18, height 5\' 5"  (1.651 m), weight 351 lb (159.213 kg), last menstrual period 08/08/2013, SpO2 91.00%. Minimal discomfort as expected.    Disposition: 01-Home or Self Care  Discharge Orders   Future Appointments Provider Department Dept Phone   12/11/2013 3:30 PM [facility] [phone]   12/19/2013 9:10 AM Crystal MerinoMatthew Huerta Sallee Hogrefe, MD [facility] [place] [phone]   Future Orders Complete By Expires   Discharge instructions  As directed    Comments:     Follow dietary guidelines as described   Increase activity slowly  As directed    No dressing needed  As directed        Medication List    STOP taking these medications       ibuprofen 200 MG tablet  Commonly known as:  ADVIL,MOTRIN     lansoprazole 30 MG capsule  Commonly known as:  PREVACID      TAKE these medications       HYDROcodone-acetaminophen 7.5-325 mg/15 ml solution  Commonly known as:  HYCET  Take 15 mLs by mouth 4 (four) times daily as needed for moderate pain.     Vitamin D (Ergocalciferol) 50000 UNITS Caps capsule  Commonly known as:  DRISDOL  Take 50,000 Units by mouth 2 (two) times a week. Thursday and Saturday            Follow-up Information   Follow up with Crystal MerinoMARTIN,Crystal Massmann B, MD.   Specialty:  General Surgery   Contact information:   [address] [place] [address] [phone]       Signed: Valarie MerinoMARTIN,Crystal Huerta 11/29/2013, 8:03 AM

## 2013-11-29 NOTE — Progress Notes (Signed)
Patient alert and oriented, pain is controlled. Patient is tolerating fluids, plan to advance to protein shake today. Reviewed Gastric Bypass discharge instructions with patient and patient is able to articulate understanding. Provided information on BELT program, Support Group and [facility] outpatient pharmacy. All questions answered, will continue to monitor.  

## 2013-11-29 NOTE — Progress Notes (Signed)
Discharge instructions reviewed with patient, vital signs are stable, incisions are within normal limits, pain medication effective, zofran given earlier for nausea but no emesis,pt to follow up with MD Stanford BreedBracey, Tyauna Lacaze N RN 11-29-2013 13:17pm

## 2013-11-29 NOTE — Progress Notes (Signed)
ANTICOAGULATION CONSULT NOTE - Initial Consult  Pharmacy Consult for Lovenox  Indication:  VTE prophylaxis x 2 weeks   Allergies  Allergen Reactions  . Shrimp [Shellfish Allergy] Swelling  . Sulfonamide Derivatives Swelling    Patient Measurements: Height: 5\' 5"  (165.1 cm) Weight: 351 lb (159.213 kg) IBW/kg (Calculated) : 57   Vital Signs: Temp: 97.7 F (36.5 C) (02/05 0535) Temp src: Oral (02/05 0535) BP: 151/79 mmHg (02/05 0535) Pulse Rate: 66 (02/05 0535)  Labs:  Recent Labs  11/27/13 1713 11/28/13 0517 11/28/13 1550 11/28/13 1920 11/29/13 0440  HGB 14.0 13.3 12.9  --  13.3  HCT 41.0 40.5 38.2  --  40.7  PLT 289 279  --   --  232  CREATININE 0.90  --   --   --   --   TROPONINI  --   --   --  <0.30  --     Estimated Creatinine Clearance: 131 ml/min (by C-G formula based on Cr of 0.9).   Medical History: Past Medical History  Diagnosis Date  . Borderline hyperlipidemia   . Elevated WBC count     mild. followed by oncology  . GERD (gastroesophageal reflux disease)   . Heart palpitations     with caffien  . History of sinusitis   . Headache(784.0)     migraines  . Arthritis   . Polycystic ovarian disease   . Borderline diabetes   . Sleep apnea     "mild sleep apnea" does not need c pap per pt    Medications:  Scheduled:  . enoxaparin (LOVENOX) injection  80 mg Subcutaneous Q24H  . protein supplement  2 oz Oral QID   Or  . protein supplement  2 oz Oral QID   Or  . protein supplement  2 oz Oral QID   Infusions:  . dextrose 5 % and 0.45 % NaCl with KCl 20 mEq/L 100 mL/hr at 11/28/13 2234   PRN: acetaminophen, acetaminophen (TYLENOL) oral liquid 160 mg/5 mL, morphine injection, ondansetron (ZOFRAN) IV, oxyCODONE  Assessment:  38 yo F, POD#2 Laparoscopic Roux en Y gastric bypass, Dr. Daphine DeutscherMartin would like her to continue Lovenox for VTE prophylaxis x 2 weeks after discharge.    She is currently on SQ heparin while inpatient, will discontinue.   Last dose given 2/5 at 0520.   Patients BMI > 30, Weight 160 kg.    Scr is WNL with CrCl > 100 ml/min  H/H, Plt all WNL and stable    Goal of Therapy:  Anti-Xa level 0.6-1 units/ml 4hrs after LMWH dose given Monitor platelets by anticoagulation protocol: Yes   Plan:  1.) Lovenox 0.5 mg/kg Q24h = 80 mg SQ daily, Scheduled first dose for 1200 given administration of SQ heparin this AM 2.) Per Dr. Daphine DeutscherMartin, continue x 2 weeks after discharge for VTE px.  3.) Consider f/u BMET/CBC after discharge to ensure dose remains appropriate x 2 weeks.     Thanks!, Will sign off, please re-consult if needed.  Lizbett Garciagarcia, Loma MessingMary Patricia PharmD Pager #: [phone] 8:15 AM 11/29/2013

## 2013-11-29 NOTE — Discharge Instructions (Signed)
Gastric Bypass Surgery Care After Refer to this sheet in the next few weeks. These discharge instructions provide you with general information on caring for yourself after you leave the hospital. Your caregiver may also give you specific instructions. Your treatment has been planned according to the most current medical practices available, but unavoidable complications sometimes occur. If you have any problems or questions after discharge, call your caregiver. HOME CARE INSTRUCTIONS  Activity  Take frequent walks throughout the day. This will help to prevent blood clots. Do not sit for longer than 45 minutes to 1 hour while awake for 4 to 6 weeks after surgery.  Continue to do coughing and deep breathing exercises once you get home. This will help to prevent pneumonia.  Do not do strenuous activities, such as heavy lifting, pushing, or pulling, until after your follow-up visit with your caregiver. Do not lift anything heavier than 10 lb (4.5 kg).  Talk with your caregiver about when you may return to work and your exercise routine.  Do not drive while taking prescription pain medicine. Nutrition  It is very important that you drink at least 80 oz (2,400 mL) of fluid a day.  You should stay on a liquid diet until your follow-up visit with your caregiver. Keep sugar-free, liquid items on hand, including:  Tea: hot or cold. Drink only decaffeinated for the first month.  Broths: beef, chicken, vegetable.  Others: water, sugar-free frozen ice pops, flavored water, gelatin (after 1 week).  Do not consume caffeine for 1 month. Large amounts of caffeine can cause dehydration.  A dietician may also give you specific instructions.  Follow your caregiver's recommendations about vitamins and protein requirements after surgery. Hygiene  You may shower and wash your hair 2 days after surgery. Pat incisions dry. Do not rub incisions with a washcloth or towel.  Follow your caregiver's  recommendations about baths and pools following surgery. Pain control  If a prescription medicine was given, follow your caregiver's directions.  You may feel some gas pain caused by the carbon dioxide used to inflate your abdomen during surgery. This pain can be felt in your chest, shoulder, back, or abdominal area. Moving around often is advised. Incision care  You may have 4 or more small incisions. They are closed with skin adhesive strips. Skin adhesive strips can get wet and will fall off on their own. Check your incisions and surrounding area daily for any redness, swelling, discoloration, fluid (drainage), or bleeding. Dark red, dried blood may appear under these coverings. This is normal.  If you have a drain, it will be removed at your follow-up visit or before you leave the hospital.  If your drain is left in, follow your caregiver's instructions on drain care.  If your drain is taken out, keep a clean, dry bandage over the drain site. SEEK MEDICAL CARE IF:   You develop persistent nausea and vomiting.  You have pain and discomfort with swallowing.  You have pain, swelling, or warmth in the lower extremities.  You have an oral temperature above 102 F (38.9 C).  You develop chills.  Your incision sites look red, swollen, or have drainage.  Your stool is black, tarry, or maroon in color.  You are lightheaded when standing.  You notice a bruise getting larger.  You have any questions or concerns. SEEK IMMEDIATE MEDICAL CARE IF:   You have chest pain.  You have severe calf pain or pain not relieved by medicine.  You develop shortness of  breath or difficulty breathing.  There is bright red blood coming from the drain.  You feel confused.  You have slurred speech.  You suddenly feel weak. MAKE SURE YOU:   Understand these instructions.  Will watch your condition.  Will get help right away if you are not doing well or get worse. Document Released:  05/25/2004 Document Revised: 02/05/2013 Document Reviewed: 03/03/2010 [facility] Patient Information 2014 [place], [place].     GASTRIC BYPASS/SLEEVE  Home Care Instructions   These instructions are to help you care for yourself when you go home.  Call: If you have any problems.   Call [phone] and ask for the surgeon on call   If you need immediate assistance come to the ER at [facility]. Tell the ER staff you are a new post-op gastric bypass or gastric sleeve patient  Signs and symptoms to report:   Severe  vomiting or nausea o If you cannot handle clear liquids for longer than 1 day, call your surgeon   Abdominal pain which does not get better after taking your pain medication   Fever greater than 100.4  F and chills   Heart rate over 100 beats a minute   Trouble breathing   Chest pain   Redness,  swelling, drainage, or foul odor at incision (surgical) sites   If your incisions open or pull apart   Swelling or pain in calf (lower leg)   Diarrhea (Loose bowel movements that happen often), frequent watery, uncontrolled bowel movements   Constipation, (no bowel movements for 3 days) if this happens: o Take Milk of Magnesia, 2 tablespoons by mouth, 3 times a day for 2 days if needed o Stop taking Milk of Magnesia once you have had a bowel movement o Call your doctor if constipation continues Or o Take Miralax  (instead of Milk of Magnesia) following the label instructions o Stop taking Miralax once you have had a bowel movement o Call your doctor if constipation continues   Anything you think is abnormal for you   Normal side effects after surgery:   Unable to sleep at night or unable to concentrate   Irritability   Being tearful (crying) or depressed  These are common complaints, possibly related to your anesthesia, stress of surgery, and change in lifestyle, that usually go away a few weeks after surgery. If these feelings continue, call your medical doctor.  Wound Care:  You may have surgical glue, steri-strips, or staples over your incisions after surgery   Surgical glue: Looks like clear film over your incisions and will wear off a little at a time   Steri-strips: Adhesive strips of tape over your incisions. You may notice a yellowish color on skin under the steri-strips. This is used to make the steri-strips stick better. Do not pull the steri-strips off - let them fall off   Staples: Staples may be removed before you leave the hospital o If you go home with staples, call [facility] for an appointment with your surgeons nurse to have staples removed 10 days after surgery, [phone]   Showering: You may shower two (2) days after your surgery unless your surgeon tells you differently o Wash gently around incisions with warm soapy water, rinse well, and gently pat dry o If you have a drain (tube from your incision), you may need someone to hold this while you shower o No tub baths until staples are removed and incisions are healed   Medications:   Medications  should be liquid or crushed if larger than the size of a dime   Extended release pills (medication that releases a little bit at a time through the  day) should not be crushed   Depending on the size and number of medications you take, you may need to space (take a few throughout the day)/change the time you take your medications so that you do not over-fill your pouch (smaller stomach)   Make sure you follow-up with you primary care physician to make medication changes needed during rapid weight loss and life -style changes   If you have diabetes, follow up with your doctor that orders your diabetes medication(s) within one week after surgery and check your blood sugar regularly    Do not drive while taking narcotics (pain medications)    Do not take acetaminophen (Tylenol) and Roxicet or Lortab Elixir at the same time since these pain medications contain acetaminophen   Diet:  First 2  Weeks You will see the nutritionist about two (2) weeks after your surgery. The nutritionist will increase the types of foods you can eat if you are handling liquids well:   If you have severe vomiting or nausea and cannot handle clear liquids lasting longer than 1 day call your surgeon Protein Shake   Drink at least 2 ounces of shake 5-6 times per day   Each serving of protein shakes (usually 8-12 ounces) should have a minimum of: o 15 grams of protein o And no more than 5 grams of carbohydrate   Goal for protein each day: o Men = 80 grams per day o Women = 60 grams per day      Protein powder may be added to fluids such as non-fat milk or Lactaid milk or Soy milk (limit to 35 grams added protein powder per serving)  Hydration   Slowly increase the amount of water and other clear liquids as tolerated (See Acceptable Fluids)   Slowly increase the amount of protein shake as tolerated   Sip fluids slowly and throughout the day   May use sugar substitutes in small amounts (no more than 6-8 packets per day; i.e. Splenda)  Fluid Goal   The first goal is to drink at least 8 ounces of protein shake/drink per day (or as directed by the nutritionist); some examples of protein shakes are [employer], [employer], EAS Edge HP, and Unjury. - See handout from pre-op Bariatric Education Class: o Slowly increase the amount of protein shake you drink as tolerated o You may find it easier to slowly sip shakes throughout the day o It is important to get your proteins in first   Your fluid goal is to drink 64-100 ounces of fluid daily o It may take a few weeks to build up to this    32 oz. (or more) should be clear liquids And   32 oz. (or more) should be full liquids (see below for examples)   Liquids should not contain sugar, caffeine, or carbonation  Clear Liquids:   Water of Sugar-free flavored water (i.e. Fruit HO, Propel)   Decaffeinated coffee or tea (sugar-free)   Crystal lite,  Wylers Lite, Minute Maid Lite   Sugar-free Jell-O   Bouillon or broth   Sugar-free Popsicle:    - Less than 20 calories each; Limit 1 per day  Full Liquids:                   Protein Shakes/Drinks + 2 choices per  day of other full liquids   Full liquids must be: o No More Than 12 grams of Carbs per serving o No More Than 3 grams of Fat per serving   Strained low-fat cream soup   Non-Fat milk   Fat-free Lactaid Milk   Sugar-free yogurt (Dannon Lite & Fit, Greek yogurt)    Vitamins and Minerals   Start 1 day after surgery unless otherwise directed by your surgeon   2 Chewable Multivitamin / Multimineral Supplement with iron (i.e. Centrum for Adults)   Vitamin B-12, 350-500 micrograms sub-lingual (place tablet under the tongue) each day   Chewable Calcium Citrate with Vitamin D-3 (Example: 3 Chewable Calcium  Plus 600 with Vitamin D-3) o Take 500 mg three (3) times a day for a total of 1500 mg each day o Do not take all 3 doses of calcium at one time as it may cause constipation, and you can only absorb 500 mg at a time o Do not mix multivitamins containing iron with calcium supplements;  take 2 hours apart o Do not substitute Tums (calcium carbonate) for your calcium   Menstruating women and those at risk for anemia ( a blood disease that causes weakness) may need extra iron o Talk to your doctor to see if you need more iron   If you need extra iron: Total daily Iron recommendation (including Vitamins) is 50 to 100 mg Iron/day   Do not stop taking or change any vitamins or minerals until you talk to your nutritionist or surgeon   Your nutritionist and/or surgeon must approve all vitamin and mineral supplements   Activity and Exercise: It is important to continue walking at home. Limit your physical activity as instructed by your doctor. During this time, use these guidelines:   Do not lift anything greater than ten  (10) pounds for at least two (2) weeks   Do not go back to work or  drive until Designer, industrial/product says you can   You may have sex when you feel comfortable o It is VERY important for female patients to use a reliable birth control method; fertility often increase after surgery o Do not get pregnant for at least 18 months   Start exercising as soon as your doctor tells you that you can o Make sure your doctor approves any physical activity   Start with a simple walking program   Walk 5-15 minutes each day, 7 days per week   Slowly increase until you are walking 30-45 minutes per day   Consider joining our BELT program. [phone] or email [email or web address]   Special Instructions Things to remember:   Free counseling is available for you and your family through collaboration between [facility] and [place]. Please call [phone] and leave a message   Use your CPAP when sleeping if this applies to you   Consider buying a medical alert bracelet that says you had lap-band surgery     You will likely have your first fill (fluid added to your band) 6 - 8 weeks after surgery   [facility] has a free Bariatric Surgery Support Group that meets monthly, the 3rd Thursday, 6pm. Calvert Cantor. You can see classes online at [email or web address]   It is very important to keep all follow up appointments with your surgeon, nutritionist, primary care physician, and behavioral health practitioner o After the first year, please follow up with your bariatric surgeon and nutritionist at least once a year in order to  maintain best weight loss results                    [facility]:  [phone]               [facility]: [phone]               Bariatric Nurse Coordinator: [phone]- [phone]  Gastric Bypass/Sleeve Home Care Instructions  Rev. 11/2012                                                         Reviewed and Endorsed                                                    by [facility] Patient Education Committee, Jan, 2014

## 2013-12-05 ENCOUNTER — Telehealth (INDEPENDENT_AMBULATORY_CARE_PROVIDER_SITE_OTHER): Payer: Self-pay

## 2013-12-05 NOTE — Telephone Encounter (Signed)
Patient called in requesting a note to go back to work on Monday. She has a Health and safety inspectordesk job. Advised I was not sure of what if any restrictions she will have and if Monday is too soon for her to go back to work. She is only 8 days s/p RNY. Advised I would send Dr Daphine DeutscherMartin a message asking for the okay and would call her back.

## 2013-12-07 ENCOUNTER — Encounter (INDEPENDENT_AMBULATORY_CARE_PROVIDER_SITE_OTHER): Payer: Self-pay

## 2013-12-07 NOTE — Telephone Encounter (Signed)
Called and spoke to patient regarding her RTW note.  Patient requested to go back to work on 12/10/13.  Patient reports she's doing since surgery and feel's that she will do fine going back to work.  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  Patient will pick up RTW note at the front desk.

## 2013-12-07 NOTE — Telephone Encounter (Signed)
Patient called back regarding her return to work note.  Patient states she needs this today.  Explained I will check with Christy and Dr. Daphine DeutscherMartin then let her know.  Patient states understanding.

## 2013-12-11 ENCOUNTER — Ambulatory Visit: Payer: BC Managed Care – PPO

## 2013-12-14 ENCOUNTER — Encounter: Payer: BC Managed Care – PPO | Attending: Surgery | Admitting: Dietician

## 2013-12-14 DIAGNOSIS — Z713 Dietary counseling and surveillance: Secondary | ICD-10-CM | POA: Insufficient documentation

## 2013-12-14 NOTE — Progress Notes (Signed)
Bariatric Class:  Appt start time: 1030 end time:  1130.  2 Week Post-Operative Nutrition Class  Patient was seen on 12/14/2013 for Post-Operative Nutrition [facility].   Crystal Huerta is here today reporting that she feels very tired since surgery. She states that her senses of taste and smell have become very sharp and are sometimes nauseating. She is having trouble getting enough fluids and protein. She has tried pureeing foods.   Surgery date: 11/27/2013 Surgery type: RYGB Start weight at [facility]: 346 on 08/25/2013 Weight today: 330 lbs Weight change: 16 lbs   TANITA  BODY COMP RESULTS  12/14/2013   BMI (kg/m^2) 54.9   Fat Mass (lbs) 190.5   Fat Free Mass (lbs) 139.5   Total Body Water (lbs) 102   The following the learning objectives were met by the patient during this course:  Identifies Phase 3A (Soft, High Proteins) Dietary Goals and will begin from 2 weeks post-operatively to 2 months post-operatively  Identifies appropriate sources of fluids and proteins   States protein recommendations and appropriate sources post-operatively  Identifies the need for appropriate texture modifications, mastication, and bite sizes when consuming solids  Identifies appropriate multivitamin and calcium sources post-operatively  Describes the need for physical activity post-operatively and will follow MD recommendations  States when to call healthcare provider regarding medication questions or post-operative complications  Handouts given during class include:  Phase 3A: Soft, High Protein Diet Handout  Band Fill Guidelines Handout  Follow-Up Plan: Patient will follow-up at [facility] in 4 weeks for 6 week post-op nutrition visit for diet advancement per MD.

## 2013-12-14 NOTE — Patient Instructions (Signed)
Goals:  Follow Phase 3A: Soft High Protein Phase  Eat 3-6 small meals/snacks, every 3-5 hrs  Increase lean protein foods to meet 60g goal  Increase fluid intake to 64oz +  Avoid drinking 15 minutes before, during and 30 minutes after eating  Aim for >30 min of physical activity daily per MD 

## 2013-12-19 ENCOUNTER — Encounter (INDEPENDENT_AMBULATORY_CARE_PROVIDER_SITE_OTHER): Payer: BC Managed Care – PPO | Admitting: Surgery

## 2014-01-01 ENCOUNTER — Telehealth (INDEPENDENT_AMBULATORY_CARE_PROVIDER_SITE_OTHER): Payer: Self-pay

## 2014-01-01 NOTE — Telephone Encounter (Signed)
Called and spoke to patient regarding appointment scheduled for 01/23/14.  I rescheduled patient's appointment to 01/04/14 patient has not been seen since her Gastric By-Pass on 11/27/13 w/Dr. Daphine DeutscherMartin.

## 2014-01-01 NOTE — Telephone Encounter (Signed)
Message copied by Maryan PulsMOORE, Nazaire Cordial on Tue Jan 01, 2014 11:20 AM ------      Message from: Marin ShutterHEGER, BRIGITT      Created: Tue Dec 18, 2013  1:10 PM      Regarding: Dr. 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  She had an appt 12/19/13 for her 1st p/o, but canceled due to weather.  I made an appt for 01/23/14.  Is that too far out?  Thx ------

## 2014-01-04 ENCOUNTER — Encounter (INDEPENDENT_AMBULATORY_CARE_PROVIDER_SITE_OTHER): Payer: Self-pay | Admitting: Surgery

## 2014-01-04 ENCOUNTER — Ambulatory Visit (INDEPENDENT_AMBULATORY_CARE_PROVIDER_SITE_OTHER): Payer: BC Managed Care – PPO | Admitting: Surgery

## 2014-01-04 VITALS — BP 118/72 | HR 76 | Temp 98.0°F | Resp 14 | Ht 65.0 in | Wt 321.2 lb

## 2014-01-04 DIAGNOSIS — Z9884 Bariatric surgery status: Secondary | ICD-10-CM

## 2014-01-04 NOTE — Progress Notes (Signed)
Crystal Huerta 38 y.o.  Body mass index is 53.45 kg/(m^2).  Patient Active Problem List   Diagnosis Date Noted  . 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 Morbid obesity with BMI of 50.0-59.9, adult 11/27/2013  . Morbid obesity BMI 58 08/15/2013  . CARDIOMEGALY 07/08/2010  . TOBACCO ABUSE 05/27/2010  . PALPITATIONS 05/26/2010    Allergies  Allergen Reactions  . Shrimp [Shellfish Allergy] Swelling  . Sulfonamide Derivatives Swelling    Past Surgical History  Procedure Laterality Date  . Wisdom tooth extraction    . Sinus surgery with instatrak    . Cholecystectomy    . Pilonidal cyst excision    . Gastric roux-en-y N/A 11/27/2013    Procedure: LAPAROSCOPIC ROUX-EN-Y GASTRIC BYPASS WITH UPPER ENDOSCOPY;  Surgeon: Valarie MerinoMatthew B Taiesha Bovard, MD;  Location: [facility];  Service: General;  Laterality: N/A;   BOOTH, SHERI L, MD No diagnosis found.  Doing well.  Tired and stamina is slow to return.  Minimal nausea and slight hair hair loss.   Matt B. Daphine DeutscherMartin, MD, [facility]  [facility] [phone] beeper [phone]  01/04/2014 4:31 PM

## 2014-01-04 NOTE — Patient Instructions (Signed)
Gastric Bypass Surgery Care After Refer to this sheet in the next few weeks. These discharge instructions provide you with general information on caring for yourself after you leave the hospital. Your caregiver may also give you specific instructions. Your treatment has been planned according to the most current medical practices available, but unavoidable complications sometimes occur. If you have any problems or questions after discharge, call your caregiver. HOME CARE INSTRUCTIONS  Activity  Take frequent walks throughout the day. This will help to prevent blood clots. Do not sit for longer than 45 minutes to 1 hour while awake for 4 to 6 weeks after surgery.  Continue to do coughing and deep breathing exercises once you get home. This will help to prevent pneumonia.  Do not do strenuous activities, such as heavy lifting, pushing, or pulling, until after your follow-up visit with your caregiver. Do not lift anything heavier than 10 lb (4.5 kg).  Talk with your caregiver about when you may return to work and your exercise routine.  Do not drive while taking prescription pain medicine. Nutrition  It is very important that you drink at least 80 oz (2,400 mL) of fluid a day.  You should stay on a liquid diet until your follow-up visit with your caregiver. Keep sugar-free, liquid items on hand, including:  Tea: hot or cold. Drink only decaffeinated for the first month.  Broths: beef, chicken, vegetable.  Others: water, sugar-free frozen ice pops, flavored water, gelatin (after 1 week).  Do not consume caffeine for 1 month. Large amounts of caffeine can cause dehydration.  A dietician may also give you specific instructions.  Follow your caregiver's recommendations about vitamins and protein requirements after surgery. Hygiene  You may shower and wash your hair 2 days after surgery. Pat incisions dry. Do not rub incisions with a washcloth or towel.  Follow your caregiver's  recommendations about baths and pools following surgery. Pain control  If a prescription medicine was given, follow your caregiver's directions.  You may feel some gas pain caused by the carbon dioxide used to inflate your abdomen during surgery. This pain can be felt in your chest, shoulder, back, or abdominal area. Moving around often is advised. Incision care  You may have 4 or more small incisions. They are closed with skin adhesive strips. Skin adhesive strips can get wet and will fall off on their own. Check your incisions and surrounding area daily for any redness, swelling, discoloration, fluid (drainage), or bleeding. Dark red, dried blood may appear under these coverings. This is normal.  If you have a drain, it will be removed at your follow-up visit or before you leave the hospital.  If your drain is left in, follow your caregiver's instructions on drain care.  If your drain is taken out, keep a clean, dry bandage over the drain site. SEEK MEDICAL CARE IF:   You develop persistent nausea and vomiting.  You have pain and discomfort with swallowing.  You have pain, swelling, or warmth in the lower extremities.  You have an oral temperature above 102 F (38.9 C).  You develop chills.  Your incision sites look red, swollen, or have drainage.  Your stool is black, tarry, or maroon in color.  You are lightheaded when standing.  You notice a bruise getting larger.  You have any questions or concerns. SEEK IMMEDIATE MEDICAL CARE IF:   You have chest pain.  You have severe calf pain or pain not relieved by medicine.  You develop shortness of   breath or difficulty breathing.  There is bright red blood coming from the drain.  You feel confused.  You have slurred speech.  You suddenly feel weak. MAKE SURE YOU:   Understand these instructions.  Will watch your condition.  Will get help right away if you are not doing well or get worse. Document Released:  05/25/2004 Document Revised: 02/05/2013 Document Reviewed: 03/03/2010 [employer] Patient Information 2014 [employer].  

## 2014-01-23 ENCOUNTER — Encounter (INDEPENDENT_AMBULATORY_CARE_PROVIDER_SITE_OTHER): Payer: BC Managed Care – PPO | Admitting: Surgery

## 2014-01-28 ENCOUNTER — Encounter: Payer: BC Managed Care – PPO | Attending: Surgery | Admitting: Dietician

## 2014-01-28 DIAGNOSIS — Z713 Dietary counseling and surveillance: Secondary | ICD-10-CM | POA: Insufficient documentation

## 2014-01-28 NOTE — Patient Instructions (Addendum)
Goals:  Follow Phase 3B: High Protein + Non-Starchy Vegetables  Eat 3-6 small meals/snacks, every 3-5 hrs  Increase lean protein foods to meet 60g goal  Increase fluid intake to 64oz +  Avoid drinking 15 minutes before, during and 30 minutes after eating  Aim for >30 min of physical activity daily   TANITA  BODY COMP RESULTS  12/14/2013 01/28/2014   BMI (kg/m^2) 54.9 52.3   Fat Mass (lbs) 190.5 180.5   Fat Free Mass (lbs) 139.5 134   Total Body Water (lbs) 102 98

## 2014-01-28 NOTE — Progress Notes (Signed)
  Follow-up visit:  8 Weeks Post-Operative RYGB Surgery  Medical Nutrition Therapy:  Appt start time: 1100 end time:  1130.  Primary concerns today: Post-operative Bariatric Surgery Nutrition Management. Crystal Huerta returns today saying things are going a lot better. She reports having more energy but no appetite; "More thirsty than hungry."    Surgery date: 11/27/2013 Surgery type: [facility]YGB [facility] on 08[facility]25/2013 Weight today: 314.5 lbs Weight change: 31.5 lbs  Goal weight: 150 lbs   TANITA  BODY COMP RESULTS  12/14/2013 01/28/2014   BMI (kg/m^2) 54.9 52.3   Fat Mass (lbs) 190.5 180.5   Fat Free Mass (lbs) 139.5 134   Total Body Water (lbs) 102 98    Preferred Learning Style:   No preference indicated   Learning Readiness:   Ready  24-hr recall: B (AM): egg with LF cheese (7-10 g) Snk (AM): Yogurt (9-12g)  L (PM): string cheese and deli meat (18g) Snk (PM): protein shake with whey powder and water if she goes to the gym (26g)  D (PM): 3-4 oz grilled chicken (21-28g) Snk (PM): none  Fluid intake: water and unsweet tea (60+ oz per day) Estimated total protein intake: 55-65 g per patient   Medications: none Supplementation: taking but doesn't like taste of Calcium  Using straws: seldom Drinking while eating: only when food gets stuck Hair loss: resolved Carbonated beverages: sips of diet soda N/V/D/C: nausea with sweet tastes; some gas - burping a lot Dumping syndrome: no  Recent physical activity:  Inconsistent related to recent family stress  Progress Towards Goal(s):  In progress.  Handouts given during visit include:  Phase 3B lean protein + non-starchy vegetables   Nutritional Diagnosis:  [place]-3.3 Overweight/obesity related to past poor dietary habits and physical inactivity as evidenced by patient w/ recent RYGB surgery following dietary guidelines for continued weight loss.     Intervention:  Nutrition counseling provided.  Teaching Method  Utilized:  Visual Auditory  Barriers to learning/adherence to lifestyle change: family stress  Demonstrated degree of understanding via:  Teach Back   Monitoring/Evaluation:  Dietary intake, exercise, and body weight. Follow up in 1.5 months for 3.5 month post-op visit.

## 2014-02-13 ENCOUNTER — Ambulatory Visit (INDEPENDENT_AMBULATORY_CARE_PROVIDER_SITE_OTHER): Payer: BC Managed Care – PPO | Admitting: Surgery

## 2014-03-12 ENCOUNTER — Encounter: Payer: BC Managed Care – PPO | Attending: Surgery | Admitting: Dietician

## 2014-03-12 DIAGNOSIS — Z713 Dietary counseling and surveillance: Secondary | ICD-10-CM | POA: Insufficient documentation

## 2014-03-12 NOTE — Progress Notes (Signed)
  Follow-up visit:  3.5 months Post-Operative RYGB Surgery  Medical Nutrition Therapy:  Appt start time: 930 end time:  1000.  Primary concerns today: Post-operative Bariatric Surgery Nutrition Management.  Crystal Huerta returns today very tearful and reporting not doing well with her diet. Her husband died a month ago. Since then, her aunt and mother-in-law died as well. She has not been eating due to grief and reports it makes her sick to eat. She is having sweet tea and soda and a few chips occasionally to settle her stomach. She is scheduled for one-on-one grief counseling on June 4. She reports having diarrhea 3-4 times a day. Recently sarted smoking again. Feels "guilty for being alive."  Surgery date: 11/27/2013 Surgery type: [facility]YGB [facility] on [facility]25/2013 Weight today: 289 lbs Weight change: 25.5 lbs Total weight lost: 57 lbs  Goal weight: 150 lbs   TANITA  BODY COMP RESULTS  12/14/2013 01/28/2014 03/12/14   BMI (kg/m^2) 54.9 52.3 48.1   Fat Mass (lbs) 190.5 180.5 162   Fat Free Mass (lbs) 139.5 134 127   Total Body Water (lbs) 102 98 93    Preferred Learning Style:   No preference indicated   Learning Readiness:   Ready  24-hr recall: B (AM):  Snk (AM):   L (PM):  Snk (PM):  D (PM):  Snk (PM):   Sporadic eating habits. No appetite due to recent death of husband.  Protein shakes, [employer] grilled chicken salad, potato chips and cheese nips, mozzarella sticks, Sheetz egg white and cheese without bread.  Fluid intake: water, soda, and sweet tea (patient doesn't know how much) Estimated total protein intake: unknown; "not enough"  Medications: Xanax Supplementation: forgetting to take them  Using straws:  Drinking while eating:  Hair loss: yes Carbonated beverages: soda N/V/D/C: diarrhea 3-4x a day; nausea with sweet tastes; some gas - burping a lot Dumping syndrome:  Recent physical activity: Gym 2 days last week  Progress Towards Goal(s):  In  progress.  Handouts given during visit include:  Counseling Services   Nutritional Diagnosis:  [place]-3.3 Overweight/obesity related to past poor dietary habits and physical inactivity as evidenced by patient w/ recent RYGB surgery following dietary guidelines for continued weight loss.     Intervention:  Nutrition education provided. Referred to counseling services. Encouraged Crystal Huerta to do her best with the nutrition recommendations at this point. Discussed the importance of fluid, protein, and supplements.  Teaching Method Utilized:  Visual Auditory  Barriers to learning/adherence to lifestyle change: family stress  Demonstrated degree of understanding via:  Teach Back   Monitoring/Evaluation:  Dietary intake, exercise, and body weight. Follow up in 1 months for 4.5 month post-op visit.

## 2014-03-12 NOTE — Patient Instructions (Addendum)
-  Lean on family, friends, and grief counselor for support  -Maintain fluid intake to 64 oz a day -Try to remember to take supplements -Increase protein intake as tolerated (60 gm goal) -Keep deli meat and cheese and yogurt on hand (add protein powder to anything you can) -Continue exercise routine -Try taking Biotin for hair loss   TANITA  BODY COMP RESULTS  12/14/2013 01/28/2014 03/12/14   BMI (kg/m^2) 54.9 52.3 48.1   Fat Mass (lbs) 190.5 180.5 162   Fat Free Mass (lbs) 139.5 134 127   Total Body Water (lbs) 102 98 93

## 2014-03-17 ENCOUNTER — Emergency Department (HOSPITAL_BASED_OUTPATIENT_CLINIC_OR_DEPARTMENT_OTHER)
Admission: EM | Admit: 2014-03-17 | Discharge: 2014-03-17 | Disposition: A | Discharge status: Home / Self Care | Payer: BC Managed Care – PPO | Attending: Emergency Medicine | Admitting: Emergency Medicine

## 2014-03-17 ENCOUNTER — Encounter (HOSPITAL_BASED_OUTPATIENT_CLINIC_OR_DEPARTMENT_OTHER): Payer: Self-pay | Admitting: Emergency Medicine

## 2014-03-17 DIAGNOSIS — Z8639 Personal history of other endocrine, nutritional and metabolic disease: Secondary | ICD-10-CM | POA: Insufficient documentation

## 2014-03-17 DIAGNOSIS — G43909 Migraine, unspecified, not intractable, without status migrainosus: Secondary | ICD-10-CM

## 2014-03-17 DIAGNOSIS — F17200 Nicotine dependence, unspecified, uncomplicated: Secondary | ICD-10-CM | POA: Insufficient documentation

## 2014-03-17 DIAGNOSIS — Z862 Personal history of diseases of the blood and blood-forming organs and certain disorders involving the immune mechanism: Secondary | ICD-10-CM | POA: Insufficient documentation

## 2014-03-17 DIAGNOSIS — M129 Arthropathy, unspecified: Secondary | ICD-10-CM | POA: Insufficient documentation

## 2014-03-17 DIAGNOSIS — Z8709 Personal history of other diseases of the respiratory system: Secondary | ICD-10-CM | POA: Insufficient documentation

## 2014-03-17 DIAGNOSIS — Z79899 Other long term (current) drug therapy: Secondary | ICD-10-CM | POA: Insufficient documentation

## 2014-03-17 DIAGNOSIS — Z8719 Personal history of other diseases of the digestive system: Secondary | ICD-10-CM | POA: Insufficient documentation

## 2014-03-17 MED ORDER — DEXAMETHASONE SODIUM PHOSPHATE 10 MG/ML IJ SOLN
10.0000 mg | Freq: Once | INTRAMUSCULAR | Status: AC
  Administered 2014-03-17: 10 mg via INTRAMUSCULAR
  Filled 2014-03-17: qty 1

## 2014-03-17 MED ORDER — KETOROLAC TROMETHAMINE 60 MG/2ML IM SOLN
60.0000 mg | Freq: Once | INTRAMUSCULAR | Status: AC
  Administered 2014-03-17: 60 mg via INTRAMUSCULAR
  Filled 2014-03-17: qty 2

## 2014-03-17 MED ORDER — ONDANSETRON 8 MG PO TBDP
8.0000 mg | ORAL_TABLET | Freq: Once | ORAL | Status: AC
  Administered 2014-03-17: 8 mg via ORAL
  Filled 2014-03-17: qty 1

## 2014-03-17 NOTE — ED Notes (Signed)
C/o headache that started around 7pm on Sat evening. C/o right sided h/a. Hx of migraines. States she has taken tylenol, caffeine, and xanax with no relief. C/o light sensitivity. C/o nausea and denies vomiting. States pain is worse with sitting. Improves with standing.

## 2014-03-17 NOTE — Discharge Instructions (Signed)
Migraine Headache A migraine headache is an intense, throbbing pain on one or both sides of your head. A migraine can last for 30 minutes to several hours. CAUSES  The exact cause of a migraine headache is not always known. However, a migraine may be caused when nerves in the brain become irritated and release chemicals that cause inflammation. This causes pain. Certain things may also trigger migraines, such as:  Alcohol.  Smoking.  Stress.  Menstruation.  Aged cheeses.  Foods or drinks that contain nitrates, glutamate, aspartame, or tyramine.  Lack of sleep.  Chocolate.  Caffeine.  Hunger.  Physical exertion.  Fatigue.  Medicines used to treat chest pain (nitroglycerine), birth control pills, estrogen, and some blood pressure medicines. SIGNS AND SYMPTOMS  Pain on one or both sides of your head.  Pulsating or throbbing pain.  Severe pain that prevents daily activities.  Pain that is aggravated by any physical activity.  Nausea, vomiting, or both.  Dizziness.  Pain with exposure to bright lights, loud noises, or activity.  General sensitivity to bright lights, loud noises, or smells. Before you get a migraine, you may get warning signs that a migraine is coming (aura). An aura may include:  Seeing flashing lights.  Seeing bright spots, halos, or zig-zag lines.  Having tunnel vision or blurred vision.  Having feelings of numbness or tingling.  Having trouble talking.  Having muscle weakness. DIAGNOSIS  A migraine headache is often diagnosed based on:  Symptoms.  Physical exam.  A CT scan or MRI of your head. These imaging tests cannot diagnose migraines, but they can help rule out other causes of headaches. TREATMENT Medicines may be given for pain and nausea. Medicines can also be given to help prevent recurrent migraines.  HOME CARE INSTRUCTIONS  Only take over-the-counter or prescription medicines for pain or discomfort as directed by your  health care provider. The use of long-term narcotics is not recommended.  Lie down in a dark, quiet room when you have a migraine.  Keep a journal to find out what may trigger your migraine headaches. For example, write down:  What you eat and drink.  How much sleep you get.  Any change to your diet or medicines.  Limit alcohol consumption.  Quit smoking if you smoke.  Get 7 9 hours of sleep, or as recommended by your health care provider.  Limit stress.  Keep lights dim if bright lights bother you and make your migraines worse. SEEK IMMEDIATE MEDICAL CARE IF:   Your migraine becomes severe.  You have a fever.  You have a stiff neck.  You have vision loss.  You have muscular weakness or loss of muscle control.  You start losing your balance or have trouble walking.  You feel faint or pass out.  You have severe symptoms that are different from your first symptoms. MAKE SURE YOU:   Understand these instructions.  Will watch your condition.  Will get help right away if you are not doing well or get worse. Document Released: 10/11/2005 Document Revised: 08/01/2013 Document Reviewed: 06/18/2013 ExitCare Patient Information 2014 [employer].  

## 2014-03-17 NOTE — ED Provider Notes (Signed)
CSN: [id number]     Arrival date & time 03/17/14  0139 History   First MD Initiated Contact with Patient 03/17/14 0216     Chief Complaint  Patient presents with  . Headache     (Consider location/radiation/quality/duration/timing/severity/associated sxs/prior Treatment) Patient is a 38 y.o. female presenting with headaches. The history is provided by the patient.  Headache Pain location:  R parietal Quality:  Dull Radiates to:  Does not radiate Severity currently:  7/10 Severity at highest:  7/10 Onset quality:  Gradual Timing:  Constant Progression:  Unchanged Chronicity:  Recurrent Similar to prior headaches: yes   Context: bright light   Relieved by:  Nothing Worsened by:  Light Ineffective treatments:  Acetaminophen Associated symptoms: no abdominal pain, no dizziness, no pain, no facial pain, no fatigue, no fever, no numbness, no seizures, no sinus pressure, no sore throat, no visual change, no vomiting and no weakness   Risk factors: no anger     Past Medical History  Diagnosis Date  . Borderline hyperlipidemia   . Elevated WBC count     mild. followed by oncology  . GERD (gastroesophageal reflux disease)   . Heart palpitations     with caffien  . History of sinusitis   . Headache(784.0)     migraines  . Arthritis   . Polycystic ovarian disease   . Borderline diabetes   . Sleep apnea     "mild sleep apnea" does not need c pap per pt   Past Surgical History  Procedure Laterality Date  . Wisdom tooth extraction    . Sinus surgery with instatrak    . Cholecystectomy    . Pilonidal cyst excision    . 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 Hypertension Father   . Hypertension Brother   . GER disease Other   . Hyperlipidemia Other   . Hypertension Other    . Stroke Other   . Diabetes Other   . Obesity Other   . Sleep apnea Other    History  Substance Use Topics  . Smoking status: Current Every Day Smoker -- 1.00 packs/day  . Smokeless tobacco: Never Used  . Alcohol Use: No   OB History   Grav Para Term Preterm Abortions TAB SAB Ect Mult Living                 Review of Systems  Constitutional: Negative for fever and fatigue.  HENT: Negative for sinus pressure and sore throat.   Eyes: Negative for pain.  Gastrointestinal: Negative for vomiting and abdominal pain.  Neurological: Positive for headaches. Negative for dizziness, seizures, facial asymmetry, weakness, light-headedness and numbness.  All other systems reviewed and are negative.     Allergies  Shrimp and Sulfonamide derivatives  Home Medications   Prior to Admission medications   Medication Sig Start Date End Date Taking? Authorizing Provider  ALPRAZolam Prudy Feeler(XANAX) 0.5 MG tablet Take 0.5 mg by mouth at bedtime as needed for anxiety.    Historical Provider, MD  calcium citrate (CALCITRATE - DOSED IN MG ELEMENTAL CALCIUM) 950 MG tablet Take 200 mg of elemental calcium by mouth daily.    Historical Provider, MD  Multiple Vitamin (MULTIVITAMIN) tablet Take 1 tablet by mouth daily.    Historical Provider, MD  ondansetron [facility](ZOFRAN)  4 MG tablet Take 1 tablet (4 mg total) by mouth every 8 (eight) hours as needed for nausea or vomiting. 11/29/13   Megan Dort, PA-C  vitamin B-12 (CYANOCOBALAMIN) 1000 MCG tablet Take 1,000 mcg by mouth daily.    Historical Provider, MD  Vitamin D, Ergocalciferol, (DRISDOL) 50000 UNITS CAPS capsule Take 50,000 Units by mouth 2 (two) times a week. Thursday and Saturday    Historical Provider, MD   BP 134/83  Pulse 68  Temp(Src) 97.7 F (36.5 C)  Resp 18  Ht 5\' 5"  (1.651 m)  Wt 289 lb (131.09 kg)  BMI 48.09 kg/m2  SpO2 98%  LMP 09/17/2013 Physical Exam  Constitutional: She is oriented to person, place, and time. She appears well-developed and  well-nourished. No distress.  HENT:  Head: Normocephalic and atraumatic.  Mouth/Throat: Oropharynx is clear and moist.  Eyes: Conjunctivae and EOM are normal. Pupils are equal, round, and reactive to light.  Neck: Normal range of motion. Neck supple.  Cardiovascular: Normal rate, regular rhythm and intact distal pulses.   Pulmonary/Chest: Effort normal and breath sounds normal. She has no wheezes. She has no rales.  Abdominal: Soft. Bowel sounds are normal. There is no tenderness. There is no rebound and no guarding.  Musculoskeletal: Normal range of motion.  Neurological: She is alert and oriented to person, place, and time. She has normal reflexes. She displays normal reflexes. No cranial nerve deficit. Coordination abnormal.  Skin: Skin is warm and dry.  Psychiatric: She has a normal mood and affect.    ED Course  Procedures (including critical care time) Labs Review Labs Reviewed - No data to display  Imaging Review No results found.   EKG Interpretation None      MDM   Final diagnoses:  None    Typical migraine there is no indication for CT or LP.  Have treated, markedly improved will d/c home   Freida Nebel K Peyson Postema-Rasch, MD 03/17/14 [phone]

## 2014-04-16 ENCOUNTER — Ambulatory Visit: Payer: BC Managed Care – PPO | Admitting: Dietician

## 2014-05-06 ENCOUNTER — Encounter: Payer: BC Managed Care – PPO | Attending: Surgery | Admitting: Dietician

## 2014-05-06 DIAGNOSIS — Z713 Dietary counseling and surveillance: Secondary | ICD-10-CM | POA: Insufficient documentation

## 2014-05-06 NOTE — Patient Instructions (Addendum)
Snack ideas:  -Dannon Sherlynn StallsLight and Fit yogurt  -Deli meat and cheese (try different kinds)  -Malawiurkey bacon and sausage  -[place]awiurkey burgers    TANITA  BODY COMP RESULTS  12/14/2013 01/28/2014 03/12/14 05/06/14   BMI (kg/m^2) 54.9 52.3 48.1 45.8   Fat Mass (lbs) 190.5 180.5 162 150.5   Fat Free Mass (lbs) 139.5 134 127 125   Total Body Water (lbs) 102 98 93 91.5

## 2014-05-06 NOTE — Progress Notes (Signed)
  Follow-up visit:  3.5 months Post-Operative RYGB Surgery  Medical Nutrition Therapy:  Appt start time: 200 end time:  230.  Primary concerns today: Post-operative Bariatric Surgery Nutrition Management.  Crystal Huerta returns today having lost 14 lbs since her last visit. She reports that she is doing much better. She is eating more frequently and better meeting her protein needs.  Surgery date: 11/27/2013 Surgery type: RYGB [facility]2013 Weight today: 275 lbs Weight change: 14 lbs Total weight lost: 70.5 lbs  Goal weight: 150 lbs   TANITA  BODY COMP RESULTS  12/14/2013 01/28/2014 03/12/14 05/06/14   BMI (kg/m^2) 54.9 52.3 48.1 45.8   Fat Mass (lbs) 190.5 180.5 162 150.5   Fat Free Mass (lbs) 139.5 134 127 125   Total Body Water (lbs) 102 98 93 91.5    Preferred Learning Style:   No preference indicated   Learning Readiness:   Ready  24-hr recall: B (AM): egg (6g) Snk (AM):   L (PM): Meat and cheese from a lunchable (14g) Snk (PM):  D (PM): 3-5 oz chicken breast OR 1 egg (21g) Snk (PM):   Fluid intake: water (patient doesn't know how much; "drink all day long") Estimated total protein intake: 40-50 g  Medications: Xanax Supplementation: taking  Using straws: No Drinking while eating: only if something is stuck Hair loss: no Carbonated beverages: very little N/V/D/C: none Dumping syndrome: yes, from 1/2 donut  Recent physical activity: Gym 1-2 days last week  Progress Towards Goal(s):  In progress.  Handouts given during visit include:  Phase 3B lean protein + non starchy    Nutritional Diagnosis:  [place]-3.3 Overweight/obesity related to past poor dietary habits and physical inactivity as evidenced by patient w/ recent RYGB surgery following dietary guidelines for continued weight loss.     Intervention:  Nutrition education provided. Encouraged the patient to increase protein intake to meet 60 gram goal. Recommended adding protein snacks  between meals.   Teaching Method Utilized:  Auditory  Barriers to learning/adherence to lifestyle change: family stress  Demonstrated degree of understanding via:  Teach Back   Monitoring/Evaluation:  Dietary intake, exercise, and body weight. Follow up in 2 months for 7 month post-op visit.

## 2014-07-08 ENCOUNTER — Encounter: Payer: Managed Care, Other (non HMO) | Attending: Surgery | Admitting: Dietician

## 2014-07-08 DIAGNOSIS — Z713 Dietary counseling and surveillance: Secondary | ICD-10-CM | POA: Diagnosis not present

## 2014-07-08 NOTE — Progress Notes (Signed)
  Follow-up visit:  7 months Post-Operative RYGB Surgery  Medical Nutrition Therapy:  Appt start time: 200 end time:  230.  Primary concerns today: Post-operative Bariatric Surgery Nutrition Management.  Crystal Huerta returns today having lost 4 pounds of fat since her last visit 2 months ago. She is eating frequently and meeting her protein needs. She is having 4-5 Hormel snack packs with meat and cheese per day. Taking in less than 1000 calories a day and exercising several times a week. She hopes to run a 5k this spring. Has lost several inches and is able to fit into more clothes. Crystal Huerta reports that she is disappointed in her weight loss.   Surgery date: 11/27/2013 Surgery type: RYGB Start weight at [facility]: 346 on 08/25/2013 Weight today: 270.5 lbs Weight change: 4.5 lbs Total weight lost: 75.5 lbs  Goal weight: 150 lbs   TANITA  BODY COMP RESULTS  12/14/2013 01/28/2014 03/12/14 05/06/14 07/08/14   BMI (kg/m^2) 54.9 52.3 48.1 45.8 45   Fat Mass (lbs) 190.5 180.5 162 150.5 146   Fat Free Mass (lbs) 139.5 134 127 125 124.5   Total Body Water (lbs) 102 98 93 91.5 91    Preferred Learning Style:   No preference indicated   Learning Readiness:   Ready  24-hr recall: B (AM): hormel snack packs (9g) Snk (AM):   L (PM): salad and hormel snack pack OR part Sheetz chicken burrito bowl Snk (PM):  D (PM): hormel snack packs OR eggs Snk (PM):   Fluid intake: water, unsweet tea (64 oz + per patient) Estimated total protein intake: 60+ grams per patient estimate  Medications: none Supplementation: taking  Using straws: sometimes Drinking while eating: only if something is stuck Hair loss: no Carbonated beverages: none N/V/D/C: none; pain if stomach gets too empty some mornings Dumping syndrome: no  Recent physical activity: full body toning class with aerobics 1.5-2 hours 3-5 days a week  Progress Towards Goal(s):  In progress.  Handouts given during visit include:  Phase 3B lean protein  + non starchy    Nutritional Diagnosis:  [place]-3.3 Overweight/obesity related to past poor dietary habits and physical inactivity as evidenced by patient w/ recent RYGB surgery following dietary guidelines for continued weight loss.     Intervention:  Nutrition education provided. Teaching Method Utilized:  Auditory  Barriers to learning/adherence to lifestyle change: family stress  Demonstrated degree of understanding via:  Teach Back   Monitoring/Evaluation:  Dietary intake, exercise, and body weight. Follow up in 1 month for 8 month post-op visit.

## 2014-07-08 NOTE — Patient Instructions (Addendum)
Snack ideas:  -Dannon Sherlynn Stalls and Fit yogurt  -Raw veggies  -[place] bacon and sausage  -[place] burgers   -Avoid getting on the scale every day (try for 2x a week) -Focus on the non-scale victories   TANITA  BODY COMP RESULTS  12/14/2013 01/28/2014 03/12/14 05/06/14 07/08/14   BMI (kg/m^2) 54.9 52.3 48.1 45.8 45   Fat Mass (lbs) 190.5 180.5 162 150.5 146   Fat Free Mass (lbs) 139.5 134 127 125 124.5   Total Body Water (lbs) 102 98 93 91.5 91

## 2014-08-19 ENCOUNTER — Ambulatory Visit: Payer: BC Managed Care – PPO | Admitting: Dietician

## 2015-02-05 IMAGING — RF DG UGI W/ GASTROGRAFIN
11 series · 11 of 11 positions shown · IV contrast (omnipaque)
Comparison: None.

FLUOROSCOPY TIME:  1 min 39 seconds

CLINICAL DATA: One day postop from Roux-en-Y gastric bypass
surgery.

EXAM:
WATER SOLUBLE UPPER GI SERIES
TECHNIQUE: Single-column upper GI series was performed using water soluble
contrast.
CONTRAST:  50mL OMNIPAQUE IOHEXOL 300 MG/ML  SOLN

[Series 1: run · 1 of 1 slices shown (1 of 10)]
[im 1/1]
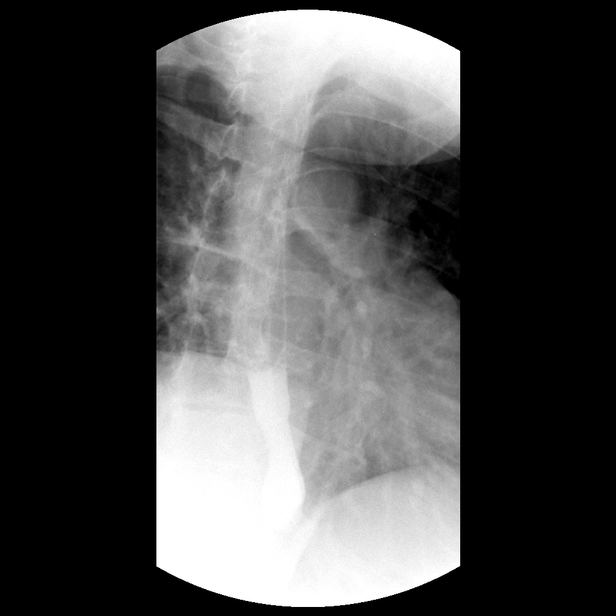

[Series 2: run · 1 of 1 slices shown (2 of 10)]
[im 1/1]
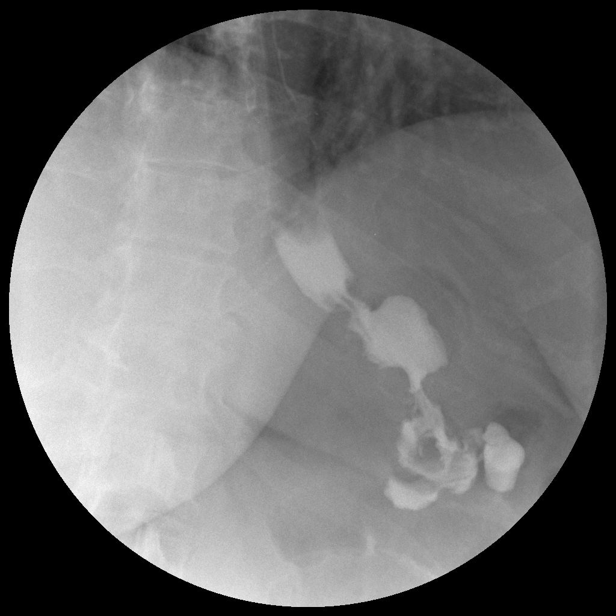

[Series 3: run · 1 of 1 slices shown (3 of 10)]
[im 1/1]
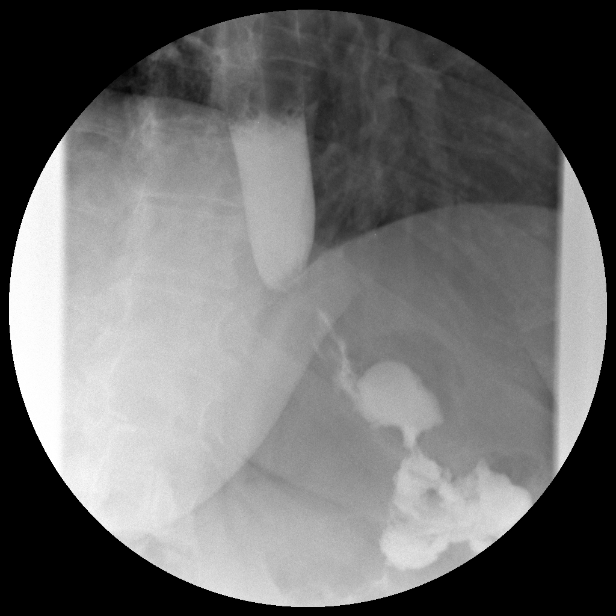

[Series 4: run · 1 of 1 slices shown (4 of 10)]
[im 1/1]
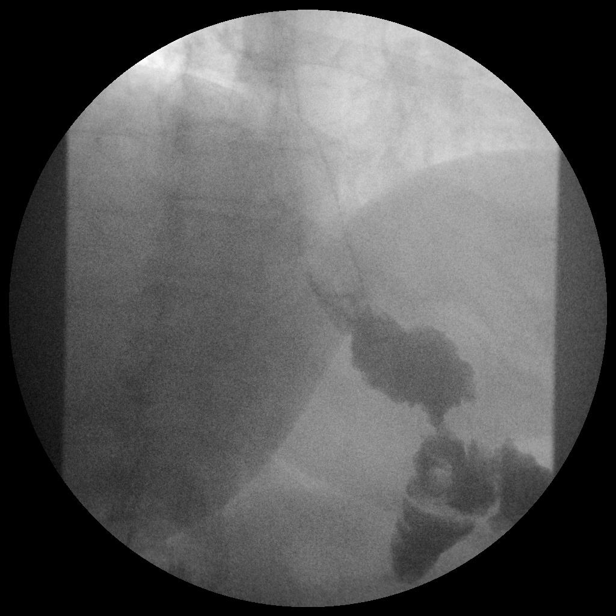

[Series 5: run · 1 of 1 slices shown (5 of 10)]
[im 1/1]
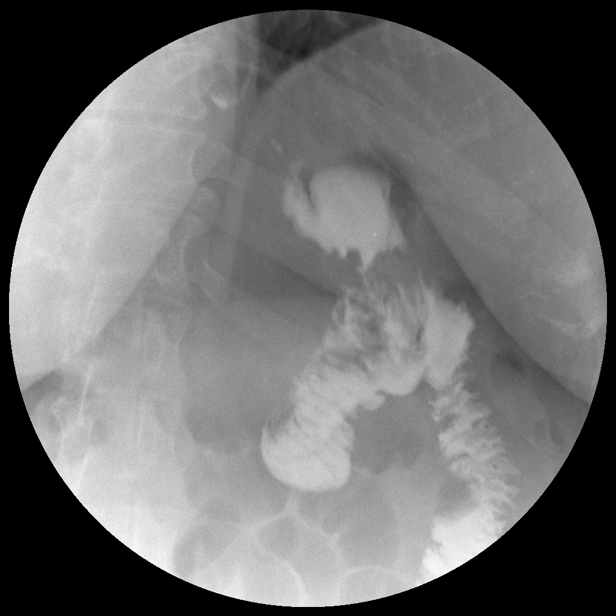

[Series 6: run · 1 of 1 slices shown (6 of 10)]
[im 1/1]
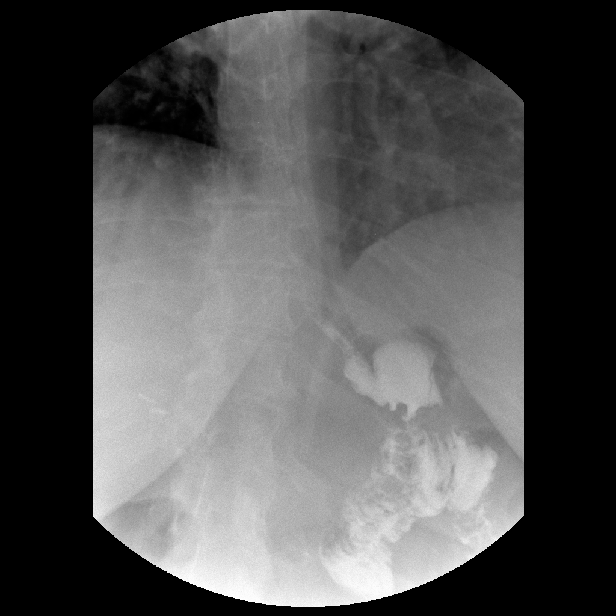

[Series 7: run · 1 of 1 slices shown (7 of 10)]
[im 1/1]
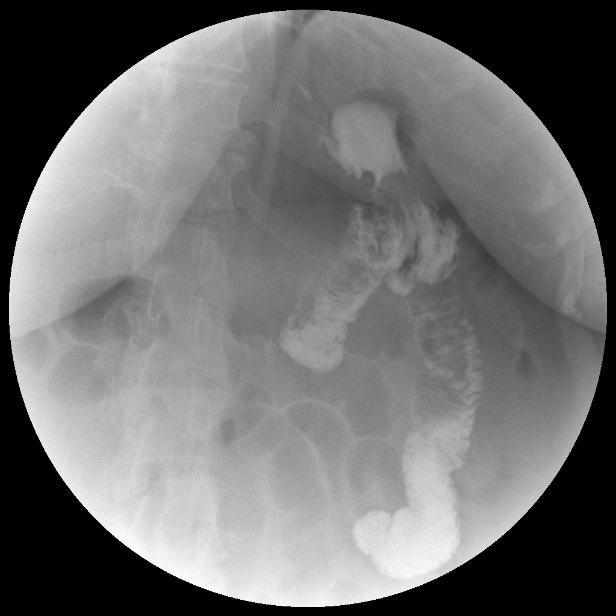

[Series 8: run · 1 of 1 slices shown (8 of 10)]
[im 1/1]
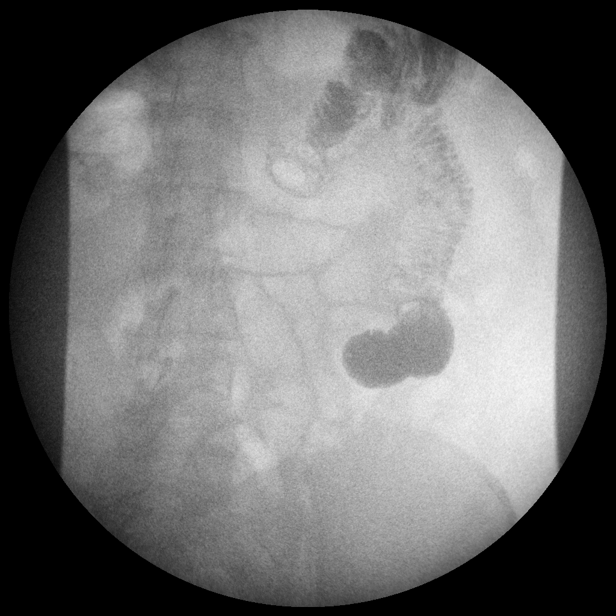

[Series 9: run · 1 of 1 slices shown (9 of 10)]
[im 1/1]
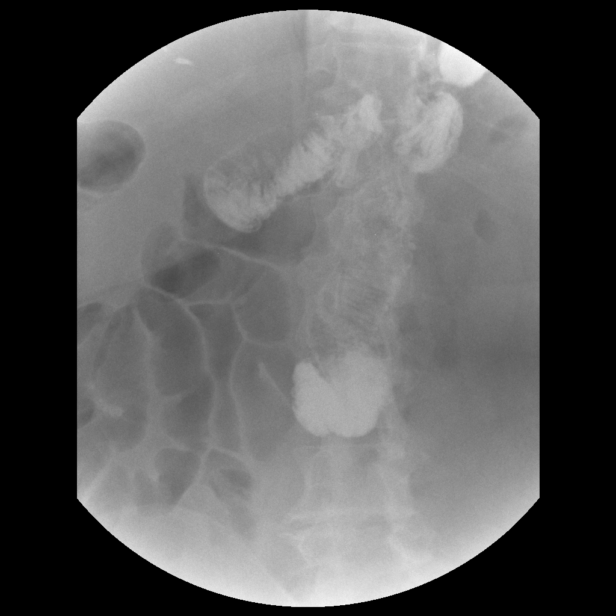

[Series 10: run · 1 of 1 slices shown (10 of 10)]
[im 1/1]
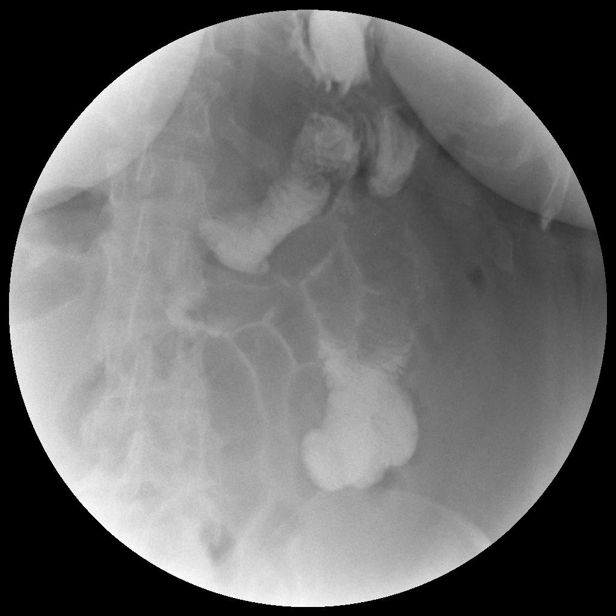

[Series 1001: view not recorded · 0.20mm/px · 1 of 1 slices shown]
[im 1/1]
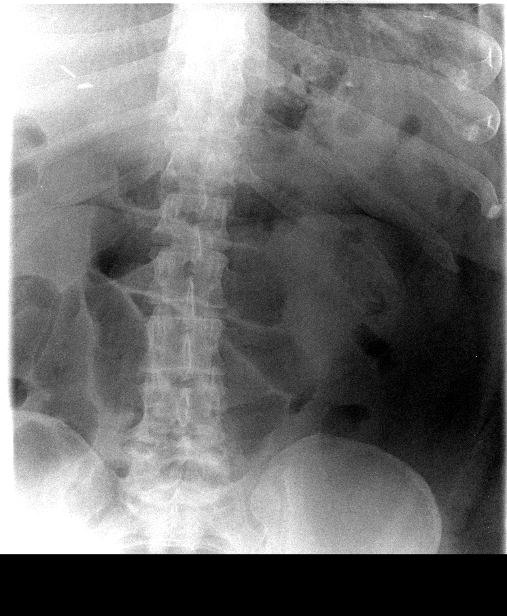

[11 of 11 positions shown; findings below may reference images not displayed]

FINDINGS: The scout radiograph shows mild gaseous distention of small bowel,
consistent with postop ileus.

Upper GI series shows normal appearance of the esophagus, without
evidence of esophageal stricture or mass. The gastric pouch is
normal in appearance and size. There is no evidence of contrast leak
or extravasation. There is prompt emptying of the gastric pouch,
with opacification of both efferent and afferent limbs of small
bowel. Efferent limb of small bowel is nondilated and normal in
appearance. No contrast leak or extravasation seen.
IMPRESSION: Expected postop appearance status post gastric bypass surgery. No
evidence of contrast leak, obstruction, or other complication.

## 2015-08-29 ENCOUNTER — Telehealth (HOSPITAL_COMMUNITY): Payer: Self-pay

## 2015-08-29 NOTE — Telephone Encounter (Signed)
This patient is overdue for recommended follow-[facility]. A mailing was sent to the patient on 06/20/15 along with [facility] with no response yet at this time.  Call attempted today to reestablish post-op care with [facility]. Patient advised that she is under the care of her PCP for Vit D deficiency that was found from labs drawn per [facility] recommendation. Will call [facility] to schedule appt in future if needed but had simply forgotten.   Jim LikeAmanda [facility] Coordinator [phone]

## 2015-10-13 ENCOUNTER — Emergency Department (HOSPITAL_BASED_OUTPATIENT_CLINIC_OR_DEPARTMENT_OTHER): Payer: No Typology Code available for payment source

## 2015-10-13 ENCOUNTER — Encounter (HOSPITAL_BASED_OUTPATIENT_CLINIC_OR_DEPARTMENT_OTHER): Payer: Self-pay | Admitting: *Deleted

## 2015-10-13 ENCOUNTER — Emergency Department (HOSPITAL_BASED_OUTPATIENT_CLINIC_OR_DEPARTMENT_OTHER)
Admission: EM | Admit: 2015-10-13 | Discharge: 2015-10-13 | Disposition: A | Discharge status: Home / Self Care | Payer: No Typology Code available for payment source | Attending: Emergency Medicine | Admitting: Emergency Medicine

## 2015-10-13 DIAGNOSIS — S8992XA Unspecified injury of left lower leg, initial encounter: Secondary | ICD-10-CM | POA: Diagnosis present

## 2015-10-13 DIAGNOSIS — Y92410 Unspecified street and highway as the place of occurrence of the external cause: Secondary | ICD-10-CM | POA: Insufficient documentation

## 2015-10-13 DIAGNOSIS — Z8639 Personal history of other endocrine, nutritional and metabolic disease: Secondary | ICD-10-CM | POA: Insufficient documentation

## 2015-10-13 DIAGNOSIS — Z8709 Personal history of other diseases of the respiratory system: Secondary | ICD-10-CM | POA: Insufficient documentation

## 2015-10-13 DIAGNOSIS — Z8679 Personal history of other diseases of the circulatory system: Secondary | ICD-10-CM | POA: Diagnosis not present

## 2015-10-13 DIAGNOSIS — Z8669 Personal history of other diseases of the nervous system and sense organs: Secondary | ICD-10-CM | POA: Diagnosis not present

## 2015-10-13 DIAGNOSIS — S8002XA Contusion of left knee, initial encounter: Secondary | ICD-10-CM | POA: Diagnosis not present

## 2015-10-13 DIAGNOSIS — Y998 Other external cause status: Secondary | ICD-10-CM | POA: Insufficient documentation

## 2015-10-13 DIAGNOSIS — F17200 Nicotine dependence, unspecified, uncomplicated: Secondary | ICD-10-CM | POA: Insufficient documentation

## 2015-10-13 DIAGNOSIS — Z79899 Other long term (current) drug therapy: Secondary | ICD-10-CM | POA: Insufficient documentation

## 2015-10-13 DIAGNOSIS — Z862 Personal history of diseases of the blood and blood-forming organs and certain disorders involving the immune mechanism: Secondary | ICD-10-CM | POA: Insufficient documentation

## 2015-10-13 DIAGNOSIS — K219 Gastro-esophageal reflux disease without esophagitis: Secondary | ICD-10-CM | POA: Diagnosis not present

## 2015-10-13 DIAGNOSIS — S0990XA Unspecified injury of head, initial encounter: Secondary | ICD-10-CM | POA: Diagnosis not present

## 2015-10-13 DIAGNOSIS — Y9389 Activity, other specified: Secondary | ICD-10-CM | POA: Diagnosis not present

## 2015-10-13 MED ORDER — ACETAMINOPHEN 500 MG PO TABS
1000.0000 mg | ORAL_TABLET | Freq: Once | ORAL | Status: AC
  Administered 2015-10-13: 1000 mg via ORAL
  Filled 2015-10-13: qty 2
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
## 2016-09-09 ENCOUNTER — Encounter (HOSPITAL_COMMUNITY): Payer: Self-pay

## 2017-06-30 ENCOUNTER — Encounter (HOSPITAL_COMMUNITY): Payer: Self-pay

## 2018-11-18 ENCOUNTER — Emergency Department (HOSPITAL_BASED_OUTPATIENT_CLINIC_OR_DEPARTMENT_OTHER)
Admission: EM | Admit: 2018-11-18 | Discharge: 2018-11-18 | Disposition: A | Discharge status: Home / Self Care | Payer: 59 | Attending: Emergency Medicine | Admitting: Emergency Medicine

## 2018-11-18 ENCOUNTER — Emergency Department (HOSPITAL_BASED_OUTPATIENT_CLINIC_OR_DEPARTMENT_OTHER): Payer: 59

## 2018-11-18 ENCOUNTER — Encounter (HOSPITAL_BASED_OUTPATIENT_CLINIC_OR_DEPARTMENT_OTHER): Payer: Self-pay | Admitting: Emergency Medicine

## 2018-11-18 ENCOUNTER — Other Ambulatory Visit: Payer: Self-pay

## 2018-11-18 DIAGNOSIS — Y999 Unspecified external cause status: Secondary | ICD-10-CM | POA: Diagnosis not present

## 2018-11-18 DIAGNOSIS — S56911A Strain of unspecified muscles, fascia and tendons at forearm level, right arm, initial encounter: Secondary | ICD-10-CM | POA: Diagnosis not present

## 2018-11-18 DIAGNOSIS — X500XXA Overexertion from strenuous movement or load, initial encounter: Secondary | ICD-10-CM | POA: Insufficient documentation

## 2018-11-18 DIAGNOSIS — F17210 Nicotine dependence, cigarettes, uncomplicated: Secondary | ICD-10-CM | POA: Insufficient documentation

## 2018-11-18 DIAGNOSIS — Y929 Unspecified place or not applicable: Secondary | ICD-10-CM | POA: Insufficient documentation

## 2018-11-18 DIAGNOSIS — S59911A Unspecified injury of right forearm, initial encounter: Secondary | ICD-10-CM | POA: Diagnosis not present

## 2018-11-18 DIAGNOSIS — Z79899 Other long term (current) drug therapy: Secondary | ICD-10-CM | POA: Diagnosis not present

## 2018-11-18 DIAGNOSIS — Y9389 Activity, other specified: Secondary | ICD-10-CM | POA: Diagnosis not present

## 2018-11-18 NOTE — ED Notes (Signed)
Pt understood dc material and follow up information. NAD noted

## 2018-11-18 NOTE — ED Provider Notes (Signed)
[facility] EMERGENCY DEPARTMENT Provider Note   CSN: [id number] Arrival date & time: 11/18/18  0354     History   Chief Complaint Injury right arm  HPI Crystal Huerta is a 43 y.o. female.  The history is provided by the patient.  She has history of borderline diabetes, borderline hyperlipidemia, polycystic ovarian disease and comes in with pain in her right forearm.  She states that yesterday, she was carrying a vacuum and felt something pop in her her proximal right forearm when she supinated her arm.  Since then, she has had pain which sometimes goes into her hand.  She area fingers are sometimes numb.  Pain is worse with movement.  She rates pain at 5/10 at rest, 8/10 if she moves her arm.  She has noted some swelling in her arm.  She has not done anything to treat it.  Past Medical History:  Diagnosis Date  . Arthritis   . Borderline diabetes   . Borderline hyperlipidemia   . Elevated WBC count    mild. followed by oncology  . GERD (gastroesophageal reflux disease)   . Headache(784.0)    migraines  . Heart palpitations    with caffien  . History of sinusitis   . Polycystic ovarian disease   . Sleep apnea    "mild sleep apnea" does not need c pap per pt    Patient Active Problem List   Diagnosis Date Noted  . 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 Morbid obesity with BMI of 50.0-59.9, adult (HCC) 11/27/2013  . Morbid obesity BMI 58 08/15/2013  . CARDIOMEGALY 07/08/2010  . TOBACCO ABUSE 05/27/2010  . PALPITATIONS 05/26/2010    Past Surgical History:  Procedure Laterality Date  . CHOLECYSTECTOMY    . GASTRIC ROUX-EN-Y N/A 11/27/2013   Procedure: LAPAROSCOPIC ROUX-EN-Y GASTRIC BYPASS WITH UPPER ENDOSCOPY;  Surgeon: Valarie Merino, MD;  Location: [facility];  Service: General;  Laterality: N/A;  . PILONIDAL CYST EXCISION    . SINUS SURGERY WITH INSTATRAK    . WISDOM TOOTH EXTRACTION       OB History   No obstetric history on file.       Home Medications    Prior to Admission medications   Medication Sig Start Date End Date Taking? Authorizing Provider  ALPRAZolam Prudy Feeler) 0.5 MG tablet Take 0.5 mg by mouth at bedtime as needed for anxiety.    [provider]  calcium citrate (CALCITRATE - DOSED IN MG ELEMENTAL CALCIUM) 950 MG tablet Take 200 mg of elemental calcium by mouth daily.    [provider]  Multiple Vitamin (MULTIVITAMIN) tablet Take 1 tablet by mouth daily.    [provider]  ondansetron (ZOFRAN) 4 MG tablet Take 1 tablet (4 mg total) by mouth every 8 (eight) hours as needed for nausea or vomiting. 11/29/13   Nonie Hoyer, PA-C  vitamin B-12 (CYANOCOBALAMIN) 1000 MCG tablet Take 1,000 mcg by mouth daily.    [provider]  Vitamin D, Ergocalciferol, (DRISDOL) 50000 UNITS CAPS capsule Take 50,000 Units by mouth 2 (two) times a week. Thursday and Saturday    [provider]    Family History Family History  Problem Relation Age of Onset  . Hypertension Father   . Hypertension Brother   . GER disease Other   . Hyperlipidemia Other   . Hypertension Other   . Stroke Other   . Diabetes Other   . Obesity Other   .  Sleep apnea Other     Social History Social History   Tobacco Use  . Smoking status: Current Every Day Smoker    Packs/day: 1.00  . Smokeless tobacco: Never Used  Substance Use Topics  . Alcohol use: No  . Drug use: No     Allergies   Shrimp [shellfish allergy] and Sulfonamide derivatives   Review of Systems Review of Systems  All other systems reviewed and are negative.    Physical Exam Updated Vital Signs BP 129/72 (BP Location: Left Arm)   Pulse 77   Temp 98 F (36.7 C) (Oral)   Resp 18   Ht 5\' 4"  (1.626 m)   Wt 121.1 kg   SpO2 100%   BMI 45.83 kg/m   Physical Exam Vitals signs and nursing note reviewed.    43 year old [place], resting comfortably and in no acute distress. Vital signs are normal. Oxygen saturation  is 100%, which is normal. Head is normocephalic and atraumatic. PERRLA, EOMI. Oropharynx is clear. Neck is nontender and supple without adenopathy or JVD. Back is nontender and there is no CVA tenderness. Lungs are clear without rales, wheezes, or rhonchi. Chest is nontender. Heart has regular rate and rhythm without murmur. Abdomen is soft, flat, nontender without masses or hepatosplenomegaly and peristalsis is normoactive. Extremities: Minimal swelling noted right forearm with faint ecchymosis seen distally.  There is tenderness palpation over the proximal right forearm without point tenderness.  Radial and ulnar pulses are strong, capillary refill is prompt.  There is normal strength of all the muscles in the forearm and there is normal sensation throughout the forearm and hand.  Remainder of extremity exam is normal. Skin is warm and dry without rash. Neurologic: Mental status is normal, cranial nerves are intact, there are no motor or sensory deficits.  ED Treatments / Results   Radiology Dg Forearm Right  Result Date: 11/18/2018 CLINICAL DATA:  RIGHT arm injury, generalized soreness. EXAM: RIGHT FOREARM - 2 VIEW COMPARISON:  None. FINDINGS: Osseous alignment is normal. Bone mineralization is normal. No acute or suspicious osseous lesion. No fracture line or displaced fracture fragment seen. Adjacent soft tissues are unremarkable. IMPRESSION: Negative. Electronically Signed   By: Bary Richard M.D.   On: 11/18/2018 04:40    Procedures Procedures  Medications Ordered in ED Medications - No data to display   Initial Impression / Assessment and Plan / ED Course  I have reviewed the triage vital signs and the nursing notes.  Pertinent imaging results that were available during my care of the patient were reviewed by me and considered in my medical decision making (see chart for details).  Injury to right forearm which appears to be muscle strain.  No red flags to suggest more serious  injury.  X-rays have been ordered.  Old records are reviewed, and she has no relevant past visits.  X-rays are negative for fracture.  Patient is advised to apply ice, use over-the-counter analgesics as needed for pain.  Follow-up with hand surgery if not improving.  Final Clinical Impressions(s) / ED Diagnoses   Final diagnoses:  Strain of right forearm, initial encounter    ED Discharge Orders    None       Dione Booze, MD 11/18/18 [phone]

## 2018-11-18 NOTE — ED Triage Notes (Signed)
Pt was cleaning her house and lifted a vacuum and noticed a bruise this morning with swelling noticed to forearm and fingers.

## 2018-11-18 NOTE — Discharge Instructions (Addendum)
Apply ice as needed.  Take iburopfen and/or acetaminophen as needed for pain.  Activity as tolerated.

## 2018-11-18 NOTE — ED Notes (Signed)
ED Provider at bedside. 

## 2019-01-15 DIAGNOSIS — F410 Panic disorder [episodic paroxysmal anxiety] without agoraphobia: Secondary | ICD-10-CM | POA: Diagnosis not present

## 2019-01-15 DIAGNOSIS — B9689 Other specified bacterial agents as the cause of diseases classified elsewhere: Secondary | ICD-10-CM | POA: Diagnosis not present

## 2019-01-15 DIAGNOSIS — Z79899 Other long term (current) drug therapy: Secondary | ICD-10-CM | POA: Diagnosis not present

## 2019-01-15 DIAGNOSIS — F411 Generalized anxiety disorder: Secondary | ICD-10-CM | POA: Diagnosis not present

## 2019-01-15 DIAGNOSIS — J019 Acute sinusitis, unspecified: Secondary | ICD-10-CM | POA: Diagnosis not present

## 2019-02-09 DIAGNOSIS — N63 Unspecified lump in unspecified breast: Secondary | ICD-10-CM | POA: Diagnosis not present

## 2019-02-09 DIAGNOSIS — N641 Fat necrosis of breast: Secondary | ICD-10-CM | POA: Diagnosis not present

## 2019-02-09 DIAGNOSIS — R928 Other abnormal and inconclusive findings on diagnostic imaging of breast: Secondary | ICD-10-CM | POA: Diagnosis not present

## 2019-04-25 DIAGNOSIS — Z9884 Bariatric surgery status: Secondary | ICD-10-CM | POA: Diagnosis not present

## 2019-04-25 DIAGNOSIS — I10 Essential (primary) hypertension: Secondary | ICD-10-CM | POA: Diagnosis not present

## 2019-04-25 DIAGNOSIS — F410 Panic disorder [episodic paroxysmal anxiety] without agoraphobia: Secondary | ICD-10-CM | POA: Diagnosis not present

## 2019-04-25 DIAGNOSIS — L02415 Cutaneous abscess of right lower limb: Secondary | ICD-10-CM | POA: Diagnosis not present

## 2019-04-25 DIAGNOSIS — E282 Polycystic ovarian syndrome: Secondary | ICD-10-CM | POA: Diagnosis not present

## 2019-04-25 DIAGNOSIS — R5383 Other fatigue: Secondary | ICD-10-CM | POA: Diagnosis not present

## 2019-04-25 DIAGNOSIS — Z87891 Personal history of nicotine dependence: Secondary | ICD-10-CM | POA: Diagnosis not present

## 2019-04-25 DIAGNOSIS — Z79899 Other long term (current) drug therapy: Secondary | ICD-10-CM | POA: Diagnosis not present

## 2019-04-25 DIAGNOSIS — Z882 Allergy status to sulfonamides status: Secondary | ICD-10-CM | POA: Diagnosis not present

## 2019-04-25 DIAGNOSIS — F411 Generalized anxiety disorder: Secondary | ICD-10-CM | POA: Diagnosis not present

## 2019-04-25 DIAGNOSIS — R030 Elevated blood-pressure reading, without diagnosis of hypertension: Secondary | ICD-10-CM | POA: Diagnosis not present

## 2019-04-25 DIAGNOSIS — R531 Weakness: Secondary | ICD-10-CM | POA: Diagnosis not present

## 2019-04-25 DIAGNOSIS — M1991 Primary osteoarthritis, unspecified site: Secondary | ICD-10-CM | POA: Diagnosis not present

## 2019-04-25 DIAGNOSIS — Z91013 Allergy to seafood: Secondary | ICD-10-CM | POA: Diagnosis not present

## 2019-08-15 DIAGNOSIS — B9689 Other specified bacterial agents as the cause of diseases classified elsewhere: Secondary | ICD-10-CM | POA: Diagnosis not present

## 2019-08-15 DIAGNOSIS — J019 Acute sinusitis, unspecified: Secondary | ICD-10-CM | POA: Diagnosis not present

## 2019-08-15 DIAGNOSIS — F410 Panic disorder [episodic paroxysmal anxiety] without agoraphobia: Secondary | ICD-10-CM | POA: Diagnosis not present

## 2019-08-15 DIAGNOSIS — F411 Generalized anxiety disorder: Secondary | ICD-10-CM | POA: Diagnosis not present

## 2019-11-18 ENCOUNTER — Encounter (HOSPITAL_BASED_OUTPATIENT_CLINIC_OR_DEPARTMENT_OTHER): Payer: Self-pay | Admitting: Emergency Medicine

## 2019-11-18 ENCOUNTER — Emergency Department (HOSPITAL_BASED_OUTPATIENT_CLINIC_OR_DEPARTMENT_OTHER): Payer: 59

## 2019-11-18 ENCOUNTER — Other Ambulatory Visit: Payer: Self-pay

## 2019-11-18 ENCOUNTER — Emergency Department (HOSPITAL_BASED_OUTPATIENT_CLINIC_OR_DEPARTMENT_OTHER)
Admission: EM | Admit: 2019-11-18 | Discharge: 2019-11-18 | Disposition: A | Discharge status: Home / Self Care | Payer: 59 | Attending: Emergency Medicine | Admitting: Emergency Medicine

## 2019-11-18 DIAGNOSIS — Z3202 Encounter for pregnancy test, result negative: Secondary | ICD-10-CM | POA: Diagnosis not present

## 2019-11-18 DIAGNOSIS — E669 Obesity, unspecified: Secondary | ICD-10-CM | POA: Diagnosis not present

## 2019-11-18 DIAGNOSIS — R109 Unspecified abdominal pain: Secondary | ICD-10-CM | POA: Insufficient documentation

## 2019-11-18 DIAGNOSIS — R110 Nausea: Secondary | ICD-10-CM | POA: Insufficient documentation

## 2019-11-18 DIAGNOSIS — F17210 Nicotine dependence, cigarettes, uncomplicated: Secondary | ICD-10-CM | POA: Diagnosis not present

## 2019-11-18 DIAGNOSIS — Z6841 Body Mass Index (BMI) 40.0 and over, adult: Secondary | ICD-10-CM | POA: Insufficient documentation

## 2019-11-18 DIAGNOSIS — Z79899 Other long term (current) drug therapy: Secondary | ICD-10-CM | POA: Diagnosis not present

## 2019-11-18 DIAGNOSIS — M545 Low back pain: Secondary | ICD-10-CM | POA: Diagnosis not present

## 2019-11-18 LAB — CBC WITH DIFFERENTIAL/PLATELET
Abs Immature Granulocytes: 0.04 10*3/uL (ref 0.00–0.07)
Basophils Absolute: 0.1 10*3/uL (ref 0.0–0.1)
Basophils Relative: 1 %
Eosinophils Absolute: 0.2 10*3/uL (ref 0.0–0.5)
Eosinophils Relative: 2 %
HCT: 40.9 % (ref 36.0–46.0)
Hemoglobin: 12.7 g/dL (ref 12.0–15.0)
Immature Granulocytes: 0 %
Lymphocytes Relative: 36 %
Lymphs Abs: 4.5 10*3/uL — ABNORMAL HIGH (ref 0.7–4.0)
MCH: 26.1 pg (ref 26.0–34.0)
MCHC: 31.1 g/dL (ref 30.0–36.0)
MCV: 84.2 fL (ref 80.0–100.0)
Monocytes Absolute: 0.9 10*3/uL (ref 0.1–1.0)
Monocytes Relative: 7 %
Neutro Abs: 6.7 10*3/uL (ref 1.7–7.7)
Neutrophils Relative %: 54 %
Platelets: 293 10*3/uL (ref 150–400)
RBC: 4.86 MIL/uL (ref 3.87–5.11)
RDW: 14.9 % (ref 11.5–15.5)
WBC: 12.4 10*3/uL — ABNORMAL HIGH (ref 4.0–10.5)
nRBC: 0 % (ref 0.0–0.2)

## 2019-11-18 LAB — BASIC METABOLIC PANEL
Anion gap: 8 (ref 5–15)
BUN: 12 mg/dL (ref 6–20)
CO2: 24 mmol/L (ref 22–32)
Calcium: 8.9 mg/dL (ref 8.9–10.3)
Chloride: 106 mmol/L (ref 98–111)
Creatinine, Ser: 0.8 mg/dL (ref 0.44–1.00)
GFR calc Af Amer: >60 mL/min (ref >60)
GFR calc non Af Amer: >60 mL/min (ref >60)
Glucose, Bld: 103 mg/dL — ABNORMAL HIGH (ref 70–99)
Potassium: 4 mmol/L (ref 3.5–5.1)
Sodium: 138 mmol/L (ref 135–145)

## 2019-11-18 LAB — URINALYSIS, ROUTINE W REFLEX MICROSCOPIC
Bilirubin Urine: NEGATIVE
Glucose, UA: NEGATIVE mg/dL
Ketones, ur: NEGATIVE mg/dL
Nitrite: NEGATIVE
Protein, ur: NEGATIVE mg/dL
Specific Gravity, Urine: <1.005 — ABNORMAL LOW (ref 1.005–1.030)
pH: 6 (ref 5.0–8.0)

## 2019-11-18 LAB — URINALYSIS, MICROSCOPIC (REFLEX)

## 2019-11-18 LAB — PREGNANCY, URINE: Preg Test, Ur: NEGATIVE

## 2019-11-18 MED ORDER — ONDANSETRON 4 MG PO TBDP: 4.0000 mg | ORAL_TABLET | Freq: Three times a day (TID) | ORAL | 0 refills | Status: AC | PRN

## 2019-11-18 MED ORDER — HYDROCODONE-ACETAMINOPHEN 5-325 MG PO TABS: 1.0000 | ORAL_TABLET | Freq: Four times a day (QID) | ORAL | 0 refills | Status: AC | PRN

## 2019-11-18 MED ORDER — ONDANSETRON HCL 4 MG/2ML IJ SOLN
4.0000 mg | Freq: Once | INTRAMUSCULAR | Status: AC
  Administered 2019-11-18: 4 mg via INTRAVENOUS
  Filled 2019-11-18: qty 2

## 2019-11-18 MED ORDER — KETOROLAC TROMETHAMINE 30 MG/ML IJ SOLN
15.0000 mg | Freq: Once | INTRAMUSCULAR | Status: AC
  Administered 2019-11-18: 15 mg via INTRAVENOUS
  Filled 2019-11-18: qty 1

## 2019-11-18 MED ORDER — SODIUM CHLORIDE 0.9 % IV BOLUS
1000.0000 mL | Freq: Once | INTRAVENOUS | Status: AC
  Administered 2019-11-18: 1000 mL via INTRAVENOUS

## 2019-11-18 NOTE — ED Provider Notes (Signed)
[facility] EMERGENCY DEPARTMENT Provider Note   CSN: [id number] Arrival date & time: 11/18/19  1904     History Chief Complaint  Patient presents with  . Flank Pain    Crystal Huerta is a 44 y.o. female.  HPI      Crystal Huerta is a 44 y.o. female, with a history of obesity, gastric bypass, and GERD, presenting to the ED with left flank pain beginning around 4 AM this morning.  Pain is waxing and waning, almost constant dull ache that is mild in intensity with intermittent sharp pain that is severe, radiating to left lower back.  At the time of this interview, patient complaining of the dull aching pain.  Accompanied by nausea. She has not had this issue before.  She states she is a Engineer, civil (consulting), but does not think she injured herself. Denies fever/chills, falls/trauma, neurologic deficits, chest pain, shortness of breath, cough, hematuria, dysuria, difficulty urinating, vomiting, diarrhea, constipation, or any other complaints.     Past Medical History:  Diagnosis Date  . Arthritis   . Borderline diabetes   . Borderline hyperlipidemia   . Elevated WBC count    mild. followed by oncology  . GERD (gastroesophageal reflux disease)   . Headache(784.0)    migraines  . Heart palpitations    with caffien  . History of sinusitis   . Polycystic ovarian disease   . Sleep apnea    "mild sleep apnea" does not need c pap per pt    Patient Active Problem List   Diagnosis Date Noted  . 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 Morbid obesity with BMI of 50.0-59.9, adult (HCC) 11/27/2013  . Morbid obesity BMI 58 08/15/2013  . CARDIOMEGALY 07/08/2010  . TOBACCO ABUSE 05/27/2010  . PALPITATIONS 05/26/2010    Past Surgical History:  Procedure Laterality Date  . CHOLECYSTECTOMY    . GASTRIC ROUX-EN-Y N/A 11/27/2013   Procedure: LAPAROSCOPIC ROUX-EN-Y GASTRIC BYPASS WITH UPPER ENDOSCOPY;  Surgeon: Valarie Merino, MD;  Location: [facility];  Service: General;  Laterality:  N/A;  . PILONIDAL CYST EXCISION    . SINUS SURGERY WITH INSTATRAK    . WISDOM TOOTH EXTRACTION       OB History   No obstetric history on file.     Family History  Problem Relation Age of Onset  . Hypertension Father   . Hypertension Brother   . GER disease Other   . Hyperlipidemia Other   . Hypertension Other   . Stroke Other   . Diabetes Other   . Obesity Other   . Sleep apnea Other     Social History   Tobacco Use  . Smoking status: Current Every Day Smoker    Packs/day: 1.00  . Smokeless tobacco: Never Used  Substance Use Topics  . Alcohol use: No  . Drug use: No    Home Medications Prior to Admission medications   Medication Sig Start Date End Date Taking? Authorizing Provider  ALPRAZolam Prudy Feeler) 0.5 MG tablet Take 0.5 mg by mouth at bedtime as needed for anxiety.   Yes [provider]  Multiple Vitamin (MULTIVITAMIN) tablet Take 1 tablet by mouth daily.   Yes [provider]  Vitamin D, Ergocalciferol, (DRISDOL) 50000 UNITS CAPS capsule Take 50,000 Units by mouth 2 (two) times a week. Thursday and Saturday   Yes [provider]  calcium citrate (CALCITRATE - DOSED IN MG ELEMENTAL CALCIUM) 950 MG tablet Take 200 mg of elemental  calcium by mouth daily.    [provider]  HYDROcodone-acetaminophen (NORCO/VICODIN) 5-325 MG tablet Take 1-2 tablets by mouth every 6 (six) hours as needed for severe pain. 11/18/19   Joy, Shawn C, PA-C  ondansetron (ZOFRAN ODT) 4 MG disintegrating tablet Take 1 tablet (4 mg total) by mouth every 8 (eight) hours as needed for nausea or vomiting. 11/18/19   Joy, Shawn C, PA-C  ondansetron (ZOFRAN) 4 MG tablet Take 1 tablet (4 mg total) by mouth every 8 (eight) hours as needed for nausea or vomiting. 11/29/13   Nonie Hoyer, PA-C  vitamin B-12 (CYANOCOBALAMIN) 1000 MCG tablet Take 1,000 mcg by mouth daily.    [provider]    Allergies    Shrimp [shellfish allergy] and Sulfonamide derivatives   Review of Systems   Review of Systems  Constitutional: Negative for chills, diaphoresis and fever.  Respiratory: Negative for cough and shortness of breath.   Cardiovascular: Negative for chest pain and leg swelling.  Gastrointestinal: Positive for nausea. Negative for abdominal pain, constipation, diarrhea and vomiting.  Genitourinary: Positive for flank pain. Negative for difficulty urinating, dysuria, hematuria, vaginal bleeding and vaginal discharge.  Musculoskeletal: Positive for back pain.  Neurological: Negative for dizziness, syncope, weakness and numbness.  All other systems reviewed and are negative.   Physical Exam Updated Vital Signs BP (!) 159/87 (BP Location: Right Arm)   Pulse 63   Temp 98.4 F (36.9 C) (Oral)   Resp 18   Ht 5\' 5"  (1.651 m)   Wt 123.8 kg   SpO2 100%   BMI 45.43 kg/m   Physical Exam Vitals and nursing note reviewed.  Constitutional:      General: She is not in acute distress.    Appearance: She is well-developed. She is obese. She is not diaphoretic.  HENT:     Head: Normocephalic and atraumatic.     Mouth/Throat:     Mouth: Mucous membranes are moist.     Pharynx: Oropharynx is clear.  Eyes:     Conjunctiva/sclera: Conjunctivae normal.  Cardiovascular:     Rate and Rhythm: Normal rate and regular rhythm.     Pulses: Normal pulses.          Radial pulses are 2+ on the right side and 2+ on the left side.       Posterior tibial pulses are 2+ on the right side and 2+ on the left side.     Heart sounds: Normal heart sounds.     Comments: Tactile temperature in the extremities appropriate and equal bilaterally. Pulmonary:     Effort: Pulmonary effort is normal. No respiratory distress.     Breath sounds: Normal breath sounds.  Abdominal:     Palpations: Abdomen is soft.     Tenderness: There is abdominal tenderness. There is no right CVA tenderness, left CVA tenderness or guarding.       Comments: Tenderness to the left flank.   Musculoskeletal:     Cervical back: Neck supple.     Right lower leg: No edema.     Left lower leg: No edema.  Lymphadenopathy:     Cervical: No cervical adenopathy.  Skin:    General: Skin is warm and dry.  Neurological:     Mental Status: She is alert.  Psychiatric:        Mood and Affect: Mood and affect normal.        Speech: Speech normal.        Behavior: Behavior  normal.     ED Results / Procedures / Treatments   Labs (all labs ordered are listed, but only abnormal results are displayed) Labs Reviewed  URINALYSIS, ROUTINE W REFLEX MICROSCOPIC - Abnormal; Notable for the following components:      Result Value   Specific Gravity, Urine <1.005 (*)    Hgb urine dipstick MODERATE (*)    Leukocytes,Ua TRACE (*)    All other components within normal limits  BASIC METABOLIC PANEL - Abnormal; Notable for the following components:   Glucose, Bld 103 (*)    All other components within normal limits  CBC WITH DIFFERENTIAL/PLATELET - Abnormal; Notable for the following components:   WBC 12.4 (*)    Lymphs Abs 4.5 (*)    All other components within normal limits  URINALYSIS, MICROSCOPIC (REFLEX) - Abnormal; Notable for the following components:   Bacteria, UA FEW (*)    All other components within normal limits  URINE CULTURE  PREGNANCY, URINE    EKG None  Radiology CT Renal Stone Study  Result Date: 11/18/2019 CLINICAL DATA:  Right flank pain EXAM: CT ABDOMEN AND PELVIS WITHOUT CONTRAST TECHNIQUE: Multidetector CT imaging of the abdomen and pelvis was performed following the standard protocol without IV contrast. COMPARISON:  None. FINDINGS: Lower chest: Lung bases are clear. No effusions. Heart is normal size. Hepatobiliary: No focal liver abnormality is seen. Status post cholecystectomy. No biliary dilatation. Pancreas: No focal abnormality or ductal dilatation. Spleen: No focal abnormality.  Normal size. Adrenals/Urinary Tract: No adrenal abnormality. No focal renal  abnormality. No stones or hydronephrosis. Urinary bladder is unremarkable. Stomach/Bowel: Prior gastric bypass. Large and small bowel unremarkable. Vascular/Lymphatic: No evidence of aneurysm or adenopathy. Reproductive: Uterus and adnexa unremarkable.  No mass. Other: No free fluid or free air. Musculoskeletal: No acute bony abnormality. IMPRESSION: No renal or ureteral stones.  No hydronephrosis. Prior cholecystectomy. Normal appendix. No acute findings. Electronically Signed   By: Charlett Nose M.D.   On: 11/18/2019 20:18    Procedures Procedures (including critical care time)  Medications Ordered in ED Medications  sodium chloride 0.9 % bolus 1,000 mL (1,000 mLs Intravenous New Bag/Given 11/18/19 2024)  ondansetron (ZOFRAN) injection 4 mg (4 mg Intravenous Given 11/18/19 2024)  ketorolac (TORADOL) 30 MG/ML injection 15 mg (15 mg Intravenous Given 11/18/19 2025)    ED Course  I have reviewed the triage vital signs and the nursing notes.  Pertinent labs & imaging results that were available during my care of the patient were reviewed by me and considered in my medical decision making (see chart for details).  Clinical Course as of Nov 17 2129  Wynelle Link Nov 18, 2019  2115 Patient states her WBC count typically runs between 12 and 13.  WBC(!): 12.4 [SJ]  2128 Patient notes this blood pressure is more consistent with her typical values.  Initial blood pressure was higher than normal.  She suspects this may have been due to pain.  BP(!): 105/58 [SJ]    Clinical Course User Index [SJ] Joy, Hillard Danker, PA-C   MDM Rules/Calculators/A&P                      Patient presents with left flank pain beginning earlier today.  Patient is nontoxic appearing, afebrile, not tachycardic, not tachypneic, not hypotensive, maintains excellent SPO2 on room air, and is in no apparent distress.  Pattern of pain suggests possible renal colic.  This is supported by microscopic hematuria on UA. Patient had resolution in  her aching pain early in her ED course and had no instances of her severe pain during her time in the ED. CT unremarkable.  Kidney stone is still a possibility, and may have passed. The patient was given instructions for home care as well as return precautions. Patient voices understanding of these instructions, accepts the plan, and is comfortable with discharge.  Vitals:   11/18/19 1909 11/18/19 1912 11/18/19 2117  BP: (!) 159/87  (!) 105/58  Pulse: 63  68  Resp: 18  18  Temp: 98.4 F (36.9 C)    TempSrc: Oral    SpO2: 100%  100%  Weight:  123.8 kg   Height:  5\' 5"  (1.651 m)      Final Clinical Impression(s) / ED Diagnoses Final diagnoses:  Flank pain    Rx / DC Orders ED Discharge Orders         Ordered    HYDROcodone-acetaminophen (NORCO/VICODIN) 5-325 MG tablet  Every 6 hours PRN     11/18/19 2115    ondansetron (ZOFRAN ODT) 4 MG disintegrating tablet  Every 8 hours PRN     11/18/19 2115           Lorayne Bender, PA-C 11/18/19 2131    Malvin Johns, MD 11/18/19 2205

## 2019-11-18 NOTE — ED Triage Notes (Signed)
Reports left flank pain that started this am around 0400 while at work.  Reports it felt better after having a bm then she went to sleep.  Upon waking up pain is worse and has come around to the front with some nausea.  Denies any vomiting or urinary symptoms.

## 2019-11-18 NOTE — Discharge Instructions (Signed)
  Kidney Stone We have a suspicion for kidney stone as a cause for the pain. Some kidney stones can take up to 30 days to pass. Hydration: Hydration is key to helping a kidney stone pass.  Have a goal of half a liter of water every hour or two. Acetaminophen: May take acetaminophen (generic for Tylenol), as needed, for pain. Your daily total maximum amount of acetaminophen from all sources should be limited to 4000mg /day for persons without liver problems, or 2000mg /day for those with liver problems. Vicodin: May take Vicodin (hydrocodone-acetaminophen) as needed for severe pain.   Do not drive or perform other dangerous activities while taking this medication as it can cause drowsiness as well as changes in reaction time and judgement.   Please note that each pill of Vicodin contains 325 mg of acetaminophen (generic for Tylenol) and the above dosage limits apply. Nausea/vomiting: Use the ondansetron (generic for Zofran) for nausea or vomiting.  This medication may not prevent all vomiting or nausea, but can help facilitate better hydration. Things that can help with nausea/vomiting also include peppermint/menthol candies, vitamin B12, and ginger. Follow-up: Follow-up with your primary care provider or the urologist as soon as possible on this matter. Return: Return to the ED for significantly increased pain, difficulty urinating, pain with urination, fever, uncontrolled vomiting, or any other major concerns.  For prescription assistance, may try using prescription discount sites or apps, such as goodrx.com

## 2019-11-20 DIAGNOSIS — R319 Hematuria, unspecified: Secondary | ICD-10-CM | POA: Diagnosis not present

## 2019-11-20 DIAGNOSIS — N390 Urinary tract infection, site not specified: Secondary | ICD-10-CM | POA: Diagnosis not present

## 2019-11-20 LAB — URINE CULTURE: Culture: 80000 — AB

## 2019-11-21 ENCOUNTER — Telehealth: Payer: Self-pay | Admitting: *Deleted

## 2019-11-21 NOTE — Telephone Encounter (Signed)
Post ED Visit - Positive Culture Follow-up  Culture report reviewed by antimicrobial stewardship pharmacist: [facility] []  , Pharm.D. []  Enzo Bi, Pharm.D., BCPS AQ-ID []  , Pharm.D., BCPS []  Celedonio Miyamoto, Pharm.D., BCPS []  Dupree, Garvin Fila.D., BCPS, AAHIVP []  , Pharm.D., BCPS, AAHIVP [x]  Georgina Pillion, PharmD, BCPS []  , PharmD, BCPS []  [place], PharmD, BCPS []  [place], PharmD []  , PharmD, BCPS []  Estella Husk, PharmD  Pharmacy Team []  Lysle Pearl, PharmD []  , PharmD []  Phillips Climes, PharmD []  , Rph []  Agapito Games) , PharmD []  Verlan Friends, PharmD []  , PharmD []  Mervyn Gay, PharmD []  , PharmD []  Vinnie Level, PharmD []  [facility], PharmD []  , PharmD []  Len Childs, PharmD   Positive urine culture Plan is to start antibiotic therapy is pt is symptomatic but unable to reach by phone. Left message  11/21/2019, 10:49 AM

## 2019-11-21 NOTE — Progress Notes (Signed)
ED Antimicrobial Stewardship Positive Culture Follow Up   Crystal Huerta is an 44 y.o. female who presented to [facility] on 11/18/2019 with a chief complaint of  Chief Complaint  Patient presents with  . Flank Pain    Recent Results (from the past 720 hour(s))  Urine culture     Status: Abnormal   Collection Time: 11/18/19  7:22 PM   Specimen: Urine, Random  Result Value Ref Range Status   Specimen Description   Final    URINE, RANDOM Performed at [facility], [address] [place], [place] [address]    Special Requests   Final    NONE Performed at [facility], [address] [place], [place] [address]    Culture 80,000 COLONIES/mL ESCHERICHIA COLI (A)  Final   Report Status 11/20/2019 FINAL  Final   Organism ID, Bacteria ESCHERICHIA COLI (A)  Final      Susceptibility   Escherichia coli - MIC*    AMPICILLIN >=32 RESISTANT Resistant     CEFAZOLIN <=4 SENSITIVE Sensitive     CEFTRIAXONE <=0.25 SENSITIVE Sensitive     CIPROFLOXACIN <=0.25 SENSITIVE Sensitive     GENTAMICIN <=1 SENSITIVE Sensitive     IMIPENEM <=0.25 SENSITIVE Sensitive     NITROFURANTOIN <=16 SENSITIVE Sensitive     TRIMETH/SULFA >=320 RESISTANT Resistant     AMPICILLIN/SULBACTAM >=32 RESISTANT Resistant     PIP/TAZO <=4 SENSITIVE Sensitive     * 80,000 COLONIES/mL ESCHERICHIA COLI    []  Treated with N/A, organism resistant to prescribed antimicrobial [x]  Patient discharged originally without antimicrobial agent and treatment is now indicated  New antibiotic prescription: Symptom check - If pt with UTI symptoms, start cephalexin 500mg  PO BID  ED Provider: , PA-C   Halana Deisher, 11/21/2019, 8:50 AM Clinical Pharmacist Monday - Friday phone -  [phone] Saturday - Sunday phone - [phone]

## 2020-01-23 DIAGNOSIS — H52223 Regular astigmatism, bilateral: Secondary | ICD-10-CM | POA: Diagnosis not present

## 2020-01-23 DIAGNOSIS — H5213 Myopia, bilateral: Secondary | ICD-10-CM | POA: Diagnosis not present

## 2020-01-23 DIAGNOSIS — H43393 Other vitreous opacities, bilateral: Secondary | ICD-10-CM | POA: Diagnosis not present

## 2020-02-06 DIAGNOSIS — F411 Generalized anxiety disorder: Secondary | ICD-10-CM | POA: Diagnosis not present

## 2020-02-06 DIAGNOSIS — F4310 Post-traumatic stress disorder, unspecified: Secondary | ICD-10-CM | POA: Diagnosis not present

## 2020-02-06 DIAGNOSIS — E559 Vitamin D deficiency, unspecified: Secondary | ICD-10-CM | POA: Diagnosis not present

## 2020-02-06 DIAGNOSIS — F410 Panic disorder [episodic paroxysmal anxiety] without agoraphobia: Secondary | ICD-10-CM | POA: Diagnosis not present

## 2020-02-06 DIAGNOSIS — Z79899 Other long term (current) drug therapy: Secondary | ICD-10-CM | POA: Diagnosis not present

## 2021-01-25 IMAGING — CT CT RENAL STONE PROTOCOL
2 of 4 series · 17 of 46 positions shown, 19 images · non-contrast
Comparison: None.

CLINICAL DATA: Right flank pain

EXAM:
CT ABDOMEN AND PELVIS WITHOUT CONTRAST
TECHNIQUE: Multidetector CT imaging of the abdomen and pelvis was performed
following the standard protocol without IV contrast.

[Series 2: axial st · axial · 0.90mm/px · z∈[+297,+742]mm · 14 of 99 slices shown, 16 images]
[im 5/99  soft-tissue]
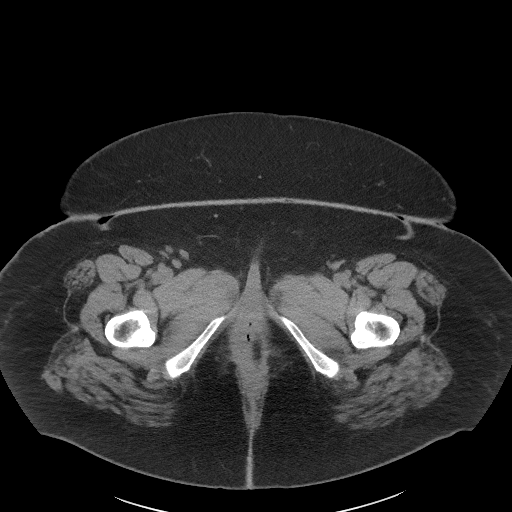
[im 5/99  bone]
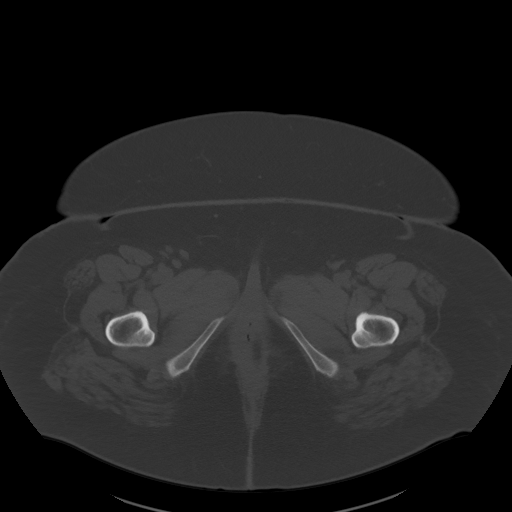
[im 13/99  soft-tissue]
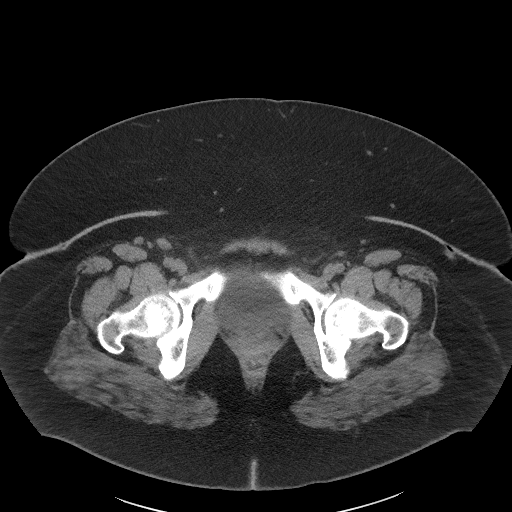
[im 18/99  soft-tissue]
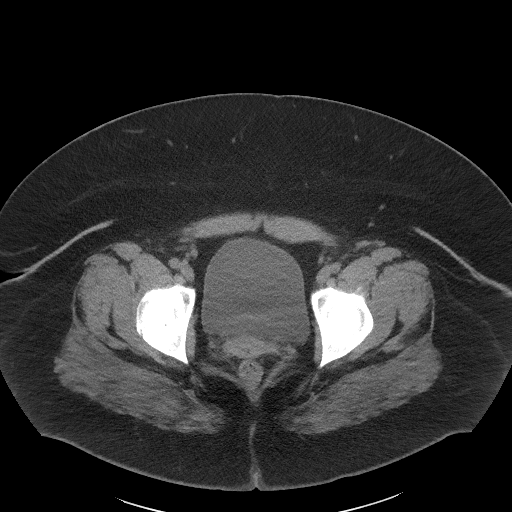
[im 26/99  soft-tissue]
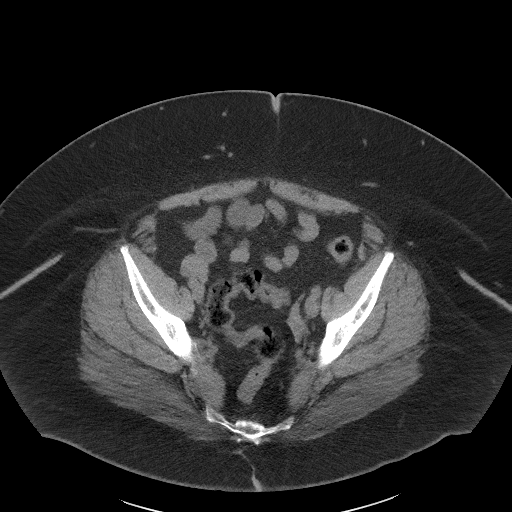
[im 35/99  soft-tissue]
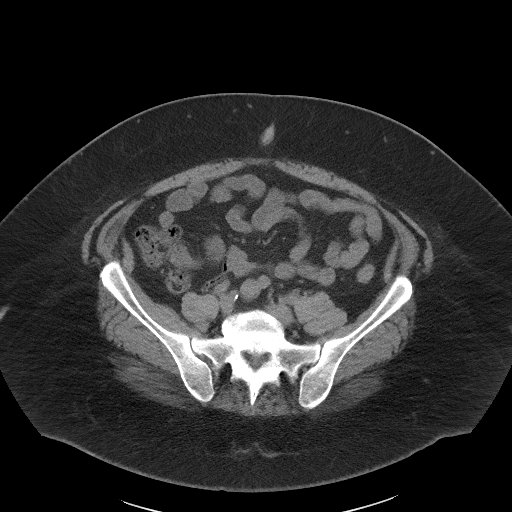
[im 39/99  soft-tissue]
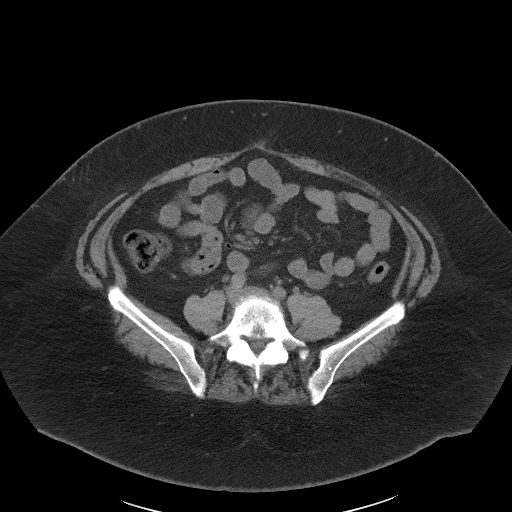
[im 47/99  soft-tissue]
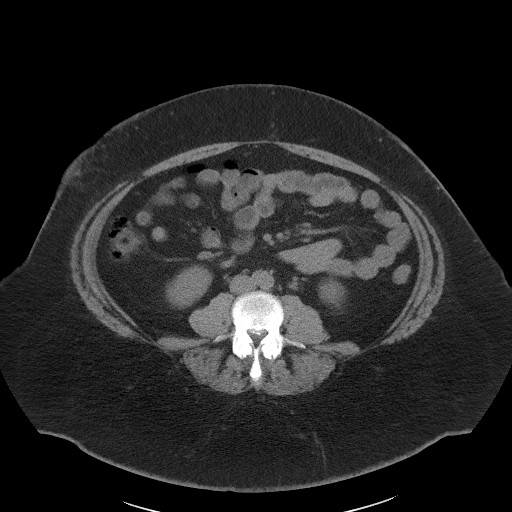
[im 52/99  soft-tissue]
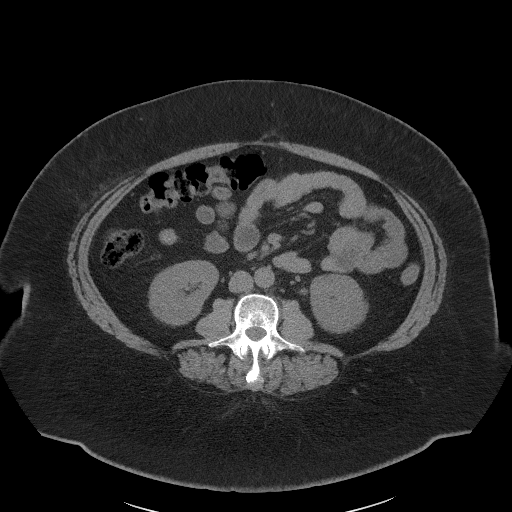
[im 60/99  soft-tissue]
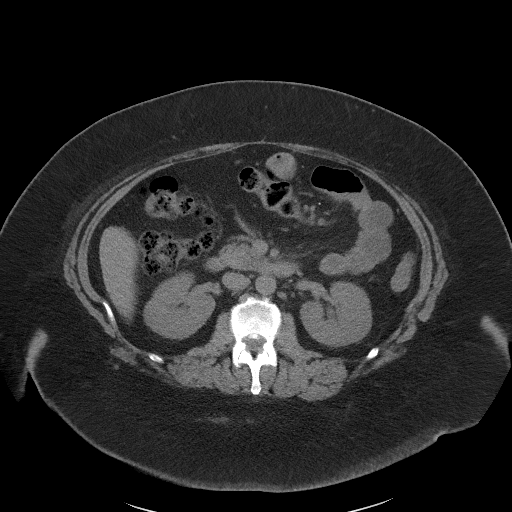
[im 60/99  bone]
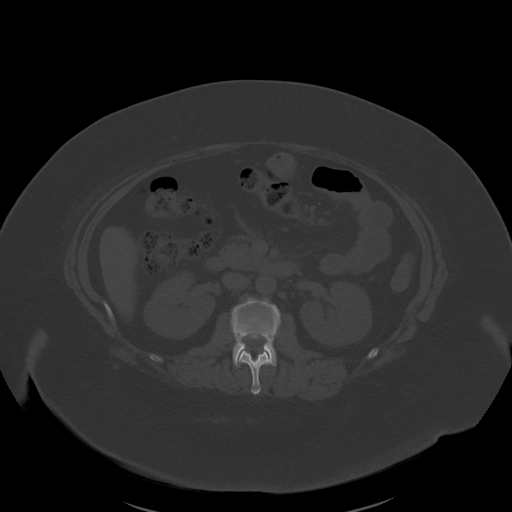
[im 64/99  soft-tissue]
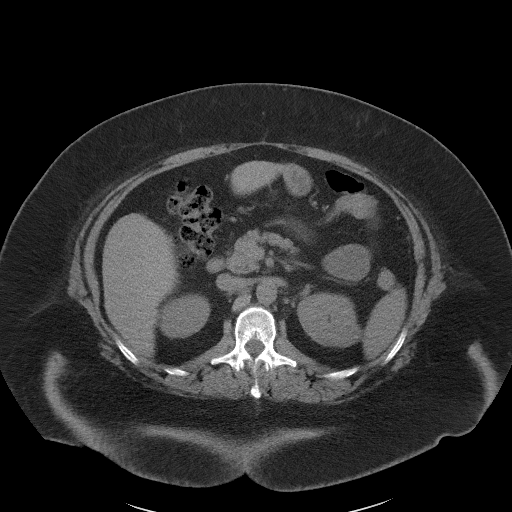
[im 73/99  soft-tissue]
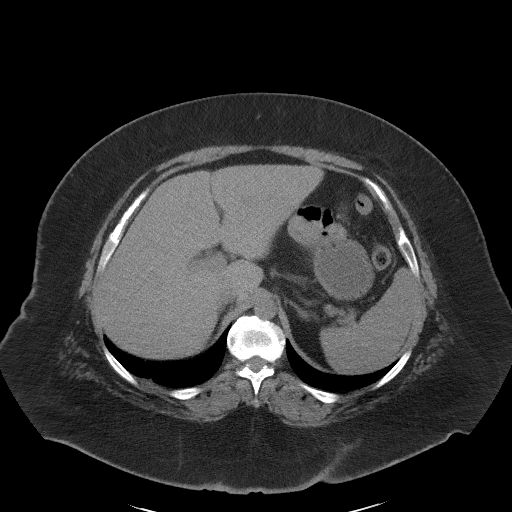
[im 81/99  soft-tissue]
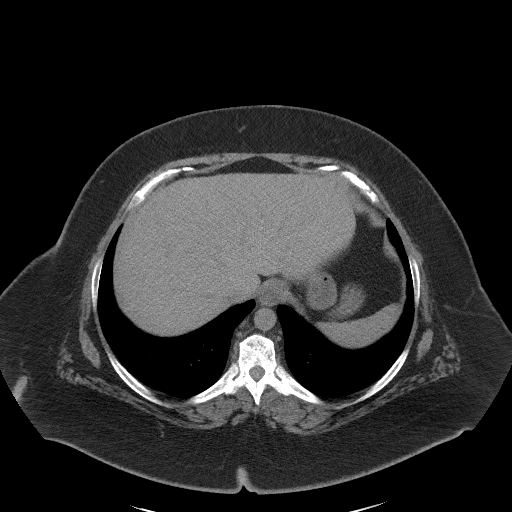
[im 86/99  soft-tissue]
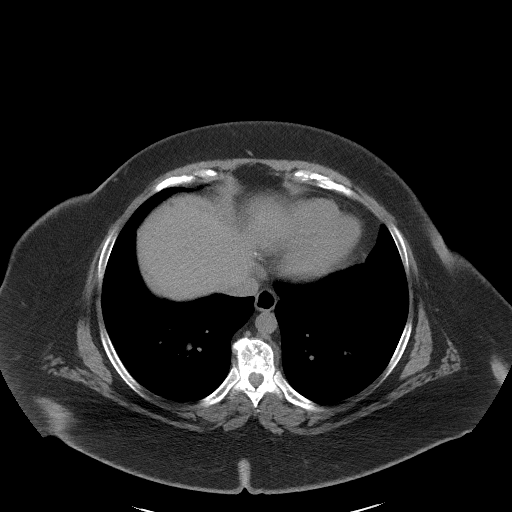
[im 94/99  soft-tissue]
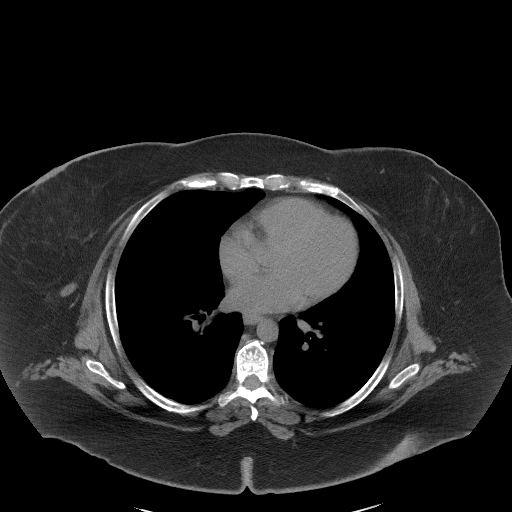

[Series 4: coronal st · coronal · 0.98mm/px · 3 of 92 slices shown]
[im 31/92  soft-tissue]
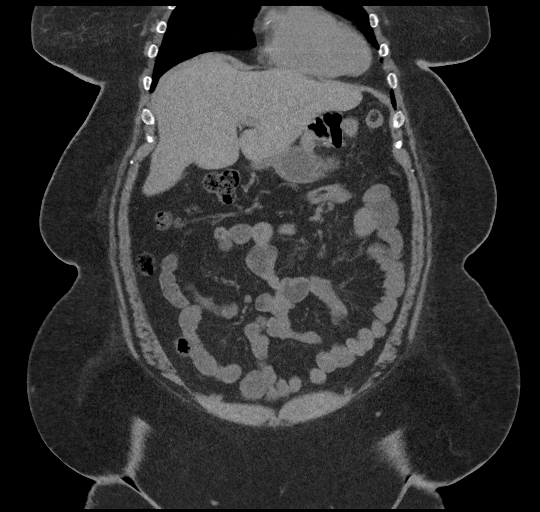
[im 41/92  soft-tissue]
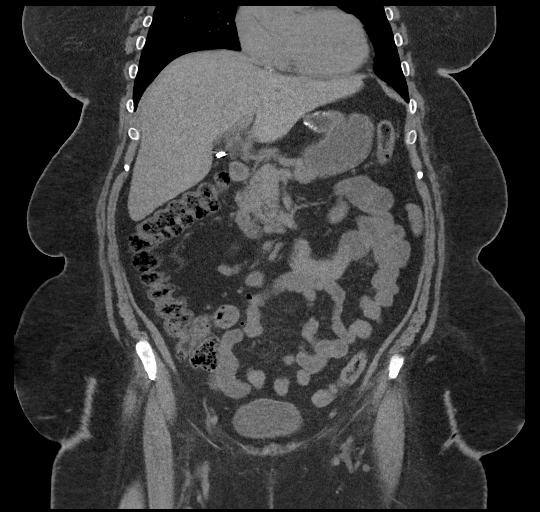
[im 51/92  soft-tissue]
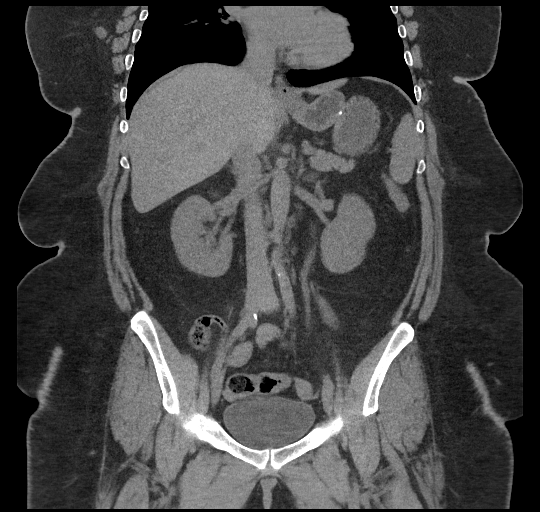

[17 of 46 positions shown; findings below may reference images not displayed]

FINDINGS: Lower chest: Lung bases are clear. No effusions. Heart is normal
size.

Hepatobiliary: No focal liver abnormality is seen. Status post
cholecystectomy. No biliary dilatation.

Pancreas: No focal abnormality or ductal dilatation.

Spleen: No focal abnormality.  Normal size.

Adrenals/Urinary Tract: No adrenal abnormality. No focal renal
abnormality. No stones or hydronephrosis. Urinary bladder is
unremarkable.

Stomach/Bowel: Prior gastric bypass. Large and small bowel
unremarkable.

Vascular/Lymphatic: No evidence of aneurysm or adenopathy.

Reproductive: Uterus and adnexa unremarkable.  No mass.

Other: No free fluid or free air.

Musculoskeletal: No acute bony abnormality.
IMPRESSION: No renal or ureteral stones.  No hydronephrosis.

Prior cholecystectomy.

Normal appendix.

No acute findings.

## 2023-02-03 LAB — COLOGUARD: COLOGUARD: POSITIVE — AB
